# Patient Record
Sex: Female | Born: 1985
Health system: Southern US, Community
[De-identification: ages and names within clinical notes are randomized; demographics above are authoritative.]

## PROBLEM LIST (undated history)

## (undated) DIAGNOSIS — J302 Other seasonal allergic rhinitis: Secondary | ICD-10-CM

## (undated) DIAGNOSIS — N809 Endometriosis, unspecified: Secondary | ICD-10-CM

## (undated) DIAGNOSIS — O009 Unspecified ectopic pregnancy without intrauterine pregnancy: Secondary | ICD-10-CM

## (undated) HISTORY — DX: Endometriosis, unspecified: N80.9

## (undated) HISTORY — PX: LAPAROSCOPIC ENDOMETRIOSIS FULGURATION: SUR769

---

## 2006-08-07 ENCOUNTER — Ambulatory Visit: Payer: Self-pay | Admitting: Family Medicine

## 2006-08-07 ENCOUNTER — Other Ambulatory Visit: Admission: RE | Admit: 2006-08-07 | Discharge: 2006-08-07 | Payer: Self-pay | Admitting: Family Medicine

## 2006-08-07 ENCOUNTER — Encounter: Payer: Self-pay | Admitting: Family Medicine

## 2006-08-07 DIAGNOSIS — J309 Allergic rhinitis, unspecified: Secondary | ICD-10-CM | POA: Insufficient documentation

## 2006-08-09 ENCOUNTER — Ambulatory Visit: Payer: Self-pay | Admitting: Family Medicine

## 2007-09-02 ENCOUNTER — Ambulatory Visit: Payer: Self-pay | Admitting: Family Medicine

## 2007-10-14 ENCOUNTER — Ambulatory Visit: Payer: Self-pay | Admitting: Family Medicine

## 2007-10-14 DIAGNOSIS — J45909 Unspecified asthma, uncomplicated: Secondary | ICD-10-CM | POA: Insufficient documentation

## 2008-07-21 ENCOUNTER — Ambulatory Visit: Payer: Self-pay | Admitting: Family Medicine

## 2008-08-16 ENCOUNTER — Other Ambulatory Visit: Admission: RE | Admit: 2008-08-16 | Discharge: 2008-08-16 | Payer: Self-pay | Admitting: Family Medicine

## 2008-08-16 ENCOUNTER — Ambulatory Visit: Payer: Self-pay | Admitting: Family Medicine

## 2008-08-16 ENCOUNTER — Encounter: Payer: Self-pay | Admitting: Family Medicine

## 2008-08-16 DIAGNOSIS — G43009 Migraine without aura, not intractable, without status migrainosus: Secondary | ICD-10-CM | POA: Insufficient documentation

## 2008-08-17 LAB — CONVERTED CEMR LAB
AST: 11 units/L (ref 0–37)
Alkaline Phosphatase: 42 units/L (ref 39–117)
BUN: 10 mg/dL (ref 6–23)
Calcium: 9.6 mg/dL (ref 8.4–10.5)
Creatinine, Ser: 0.7 mg/dL (ref 0.40–1.20)
Glucose, Bld: 110 mg/dL — ABNORMAL HIGH (ref 70–99)
LDL Cholesterol: 140 mg/dL — ABNORMAL HIGH (ref 0–99)
VLDL: 16 mg/dL (ref 0–40)

## 2008-09-03 ENCOUNTER — Ambulatory Visit: Payer: Self-pay | Admitting: Family Medicine

## 2008-09-03 DIAGNOSIS — R7309 Other abnormal glucose: Secondary | ICD-10-CM | POA: Insufficient documentation

## 2008-11-19 ENCOUNTER — Telehealth: Payer: Self-pay | Admitting: Family Medicine

## 2008-12-13 ENCOUNTER — Ambulatory Visit: Payer: Self-pay | Admitting: Family Medicine

## 2009-03-03 ENCOUNTER — Ambulatory Visit: Payer: Self-pay | Admitting: Family Medicine

## 2009-03-03 LAB — CONVERTED CEMR LAB
Bilirubin Urine: NEGATIVE
Ketones, urine, test strip: NEGATIVE
Nitrite: NEGATIVE
Specific Gravity, Urine: 1.01
WBC Urine, dipstick: NEGATIVE
pH: 6.5

## 2009-04-28 ENCOUNTER — Encounter: Payer: Self-pay | Admitting: Family Medicine

## 2009-05-23 ENCOUNTER — Ambulatory Visit: Payer: Self-pay | Admitting: Occupational Medicine

## 2009-08-15 ENCOUNTER — Ambulatory Visit: Payer: Self-pay | Admitting: Family Medicine

## 2009-08-15 LAB — CONVERTED CEMR LAB
Glucose, Urine, Semiquant: NEGATIVE
Urobilinogen, UA: 0.2
WBC Urine, dipstick: NEGATIVE

## 2009-08-16 ENCOUNTER — Encounter: Payer: Self-pay | Admitting: Family Medicine

## 2009-08-22 ENCOUNTER — Encounter: Admission: RE | Admit: 2009-08-22 | Discharge: 2009-08-22 | Payer: Self-pay | Admitting: Family Medicine

## 2009-08-22 ENCOUNTER — Ambulatory Visit: Payer: Self-pay | Admitting: Family Medicine

## 2009-08-22 DIAGNOSIS — R059 Cough, unspecified: Secondary | ICD-10-CM | POA: Insufficient documentation

## 2009-08-22 DIAGNOSIS — R05 Cough: Secondary | ICD-10-CM

## 2009-08-22 LAB — CONVERTED CEMR LAB
Bilirubin Urine: NEGATIVE
Blood in Urine, dipstick: NEGATIVE
Ketones, urine, test strip: NEGATIVE
Nitrite: NEGATIVE
Protein, U semiquant: NEGATIVE
Urobilinogen, UA: 0.2
WBC Urine, dipstick: NEGATIVE

## 2009-08-23 ENCOUNTER — Encounter: Payer: Self-pay | Admitting: Family Medicine

## 2009-08-25 ENCOUNTER — Telehealth: Payer: Self-pay | Admitting: Family Medicine

## 2009-09-14 ENCOUNTER — Encounter: Payer: Self-pay | Admitting: Family Medicine

## 2009-10-31 ENCOUNTER — Ambulatory Visit: Payer: Self-pay | Admitting: Family Medicine

## 2009-10-31 DIAGNOSIS — J019 Acute sinusitis, unspecified: Secondary | ICD-10-CM | POA: Insufficient documentation

## 2010-01-19 ENCOUNTER — Encounter: Payer: Self-pay | Admitting: Family Medicine

## 2010-02-08 NOTE — Assessment & Plan Note (Signed)
Summary: POSSIBLE SINUS INFECTION   Vital Signs:  Patient Profile:   25 Years Old Female CC:      Taking amoxicillian until 2 weeks ago then sx returned, earache, sore throat, cough, congestion x 2 weeks Height:     65.2 inches Weight:      145 pounds O2 Sat:      98 % O2 treatment:    Room Air Temp:     99.7 degrees F oral Pulse rate:   109 / minute Pulse rhythm:   regular Resp:     12 per minute BP sitting:   127 / 83  (right arm) Cuff size:   regular  Vitals Entered By: Emilio Math (May 23, 2009 1:09 PM)                  Current Allergies: ! * POLLEN ! * MOLDHistory of Present Illness Chief Complaint: Taking amoxicillian until 2 weeks ago then sx returned, earache, sore throat, cough, congestion x 2 weeks History of Present Illness: Very pleasant 25 YO WF.  History of moderate allergies in the past that required allergy shots.   In mid to late april, she had an episode of sinusitus and was treated with amoxicillin by the [facility].   She has also been out of flonase for about a month.   Since finishing the amoxicillin last week, symptoms of nasal congestion and post nasal drip have returned.  No fever, no ear pain.   Has bad headaches.    No sore throat.   Current Meds ALLEGRA 180 MG  TABS (FEXOFENADINE HCL) Take 1 tablet by mouth once a day KARIVA 0.15-0.02/0.01 MG (21/5) TABS (DESOGESTREL-ETHINYL ESTRADIOL) Take 1 tablet by mouth once a day VENTOLIN HFA 108 (90 BASE) MCG/ACT AERS (ALBUTEROL SULFATE) 2-4 puffs every 4-6 hours as needed FLUTICASONE PROPIONATE 50 MCG/ACT SUSP (FLUTICASONE PROPIONATE) 2 sprays in each nostril once daily AZITHROMYCIN 250 MG  TABS (AZITHROMYCIN) 2 by  mouth today and then 1 daily for 4 days  REVIEW OF SYSTEMS Constitutional Symptoms      Denies fever, chills, night sweats, weight loss, weight gain, and fatigue.  Eyes       Complains of eye drainage.      Denies change in vision, eye pain, glasses, contact lenses, and eye  surgery. Ear/Nose/Throat/Mouth       Complains of ear pain, sinus problems, and sore throat.      Denies hearing loss/aids, change in hearing, ear discharge, dizziness, frequent runny nose, frequent nose bleeds, hoarseness, and tooth pain or bleeding.  Respiratory       Complains of dry cough and asthma.      Denies productive cough, wheezing, shortness of breath, bronchitis, and emphysema/COPD.  Cardiovascular       Denies murmurs, chest pain, and tires easily with exhertion.    Gastrointestinal       Denies stomach pain, nausea/vomiting, diarrhea, constipation, blood in bowel movements, and indigestion. Genitourniary       Denies painful urination, kidney stones, and loss of urinary control. Neurological       Complains of headaches.      Denies paralysis, seizures, and fainting/blackouts. Musculoskeletal       Denies muscle pain, joint pain, joint stiffness, decreased range of motion, redness, swelling, muscle weakness, and gout.  Skin       Denies bruising, unusual mles/lumps or sores, and hair/skin or nail changes.  Psych       Denies mood changes, temper/anger  issues, anxiety/stress, speech problems, depression, and sleep problems.  Past History:  Past Medical History: Reviewed history from 10/14/2007 and no changes required. Current Problems:  ASTHMA, INTRINSIC (ICD-493.10) ALLERGIC RHINITIS (ICD-477.9)  Past Surgical History: Reviewed history from 08/07/2006 and no changes required. None  Family History: Reviewed history from 08/07/2006 and no changes required. Aunt with BrCa,  GF w/ Lung Ca, DM, hi chol, HTN Mother depression  Social History: Reviewed history from 12/13/2008 and no changes required. Part time sub teacher for elementary school.  married.  Never Smoked Alcohol use-no Drug use-no Regular exercise-yes Physical Exam General appearance: well developed, well nourished, no acute distress Eyes: conjunctivae and lids normal Pupils: equal, round,  reactive to light Ears: normal, no lesions or deformities Nasal: swollen red turbinates with congestion Oral/Pharynx: tongue normal, posterior pharynx without erythema or exudate Chest/Lungs: no rales, wheezes, or rhonchi bilateral, breath sounds equal without effort Heart: regular rate and  rhythm, no murmur Assessment New Problems: ALLERGIC RHINITIS (ICD-477.9)   Plan New Medications/Changes: AZITHROMYCIN 250 MG  TABS (AZITHROMYCIN) 2 by  mouth today and then 1 daily for 4 days  #6 x 0, 05/23/2009, Kathrine Haddock MD FLUTICASONE PROPIONATE 50 MCG/ACT SUSP (FLUTICASONE PROPIONATE) 2 sprays in each nostril once daily  #1 x 1, 05/23/2009, Kathrine Haddock MD  New Orders: New Patient Level II [phone] Planning Comments:   needs to get back on flonase.  This has helped much in the past.  I wrote a prescription for azithromycin.   Instructed patient NOT to get it filled yet.   Wait and see if flonase takes care of her symptoms Consider restarting allergy shots if flonase does not improve symptoms.   The patient and/or caregiver has been counseled thoroughly with regard to medications prescribed including dosage, schedule, interactions, rationale for use, and possible side effects and they verbalize understanding.  Diagnoses and expected course of recovery discussed and will return if not improved as expected or if the condition worsens. Patient and/or caregiver verbalized understanding.  Prescriptions: AZITHROMYCIN 250 MG  TABS (AZITHROMYCIN) 2 by  mouth today and then 1 daily for 4 days  #6 x 0   Entered and Authorized by:   Kathrine Haddock MD   Signed by:   Kathrine Haddock MD on 05/23/2009   Method used:   Electronically to        [facility] (retail)       [address]       [place], [place]  [address]       Ph: [phone]       Fax: [phone]   RxID:   [id number] FLUTICASONE PROPIONATE 50 MCG/ACT SUSP (FLUTICASONE PROPIONATE) 2 sprays in each nostril once daily  #1 x 1    Entered and Authorized by:   Kathrine Haddock MD   Signed by:   Kathrine Haddock MD on 05/23/2009   Method used:   Electronically to        [facility] (retail)       [address]       [place], [place]  [address]       Ph: [phone]       Fax: [phone]   RxID:   [id number]

## 2010-02-08 NOTE — Letter (Signed)
[facility]   Imported By: Lanelle Bal 05/05/2009 11:00:35  _____________________________________________________________________  External Attachment:    Type:   Image     Comment:   External Document

## 2010-02-08 NOTE — Assessment & Plan Note (Signed)
Summary: UTI   Vital Signs:  Patient profile:   25 year old female Weight:      149.12 pounds Temp:     97.6 degrees F oral Pulse rate:   109 / minute Pulse rhythm:   regular BP sitting:   134 / 92 Cuff size:   regular  Vitals Entered By: Kern Reap CMA Duncan Dull) (March 03, 2009 1:20 PM) CC: possible UTI, disuria, flank pain Is Patient Diabetic? No   Primary Care Provider:  Linford Arnold, C  CC:  possible UTI, disuria, and flank pain.  History of Present Illness: possible UTI, dysuria, flank pain.  Sxs for about 3 days. No hematuria. Some burning.  Some low back pain. No fever.  No N, V.  Not having her periods. Shooting more to the left. No family hx of kidney stones.  Back doesn't feel ike a pulled muscle.   Allergies: No Known Drug Allergies  Physical Exam  General:  Well-developed,well-nourished,in no acute distress; alert,appropriate and cooperative throughout examination Head:  Normocephalic and atraumatic without obvious abnormalities. No apparent alopecia or balding.   Impression & Recommendations:  Problem # 1:  UTI (ICD-599.0)  Her updated medication list for this problem includes:    Ciprofloxacin Hcl 500 Mg Tabs (Ciprofloxacin hcl) .Marland Kitchen... Take 1 tablet by mouth two times a day for 3 days  Orders: T-Urine Culture (Spectrum Order) [id number]) T-Urine Microscopic [id number])  Encouraged to push clear liquids, get enough rest, and take acetaminophen as needed. To be seen in 10 days if no improvement, sooner if worse. Can also use AZO for sxs relief. If not better by Monday then consider stones since UA only + for for blood.   Complete Medication List: 1)  Allegra 180 Mg Tabs (Fexofenadine hcl) .... Take 1 tablet by mouth once a day 2)  Kariva 0.15-0.02/0.01 Mg (21/5) Tabs (Desogestrel-ethinyl estradiol) .... Take 1 tablet by mouth once a day 3)  Ventolin Hfa 108 (90 Base) Mcg/act Aers (Albuterol sulfate) .... 2-4 puffs every 4-6 hours as needed 4)   Amitriptyline Hcl 25 Mg Tabs (Amitriptyline hcl) .... Take 1 tablet by mouth once a day 5)  Ciprofloxacin Hcl 500 Mg Tabs (Ciprofloxacin hcl) .... Take 1 tablet by mouth two times a day for 3 days  Other Orders: UA Dipstick w/o Micro (automated)  (81003) Prescriptions: CIPROFLOXACIN HCL 500 MG TABS (CIPROFLOXACIN HCL) Take 1 tablet by mouth two times a day for 3 days  #6 x 0   Entered and Authorized by:   Nani Gasser MD   Signed by:   Nani Gasser MD on 03/03/2009   Method used:   Electronically to        [facility]  [address] (retail)       [address]       [place], [place]  [address]       Ph: [phone]       Fax: [phone]   RxID:   [id number]   Laboratory Results   Urine Tests  Date/Time Received: March 03, 2009   Routine Urinalysis   Color: yellow Appearance: Clear Glucose: negative   (Normal Range: Negative) Bilirubin: negative   (Normal Range: Negative) Ketone: negative   (Normal Range: Negative) Spec. Gravity: 1.010   (Normal Range: 1.003-1.035) Blood: trace-intact   (Normal Range: Negative) pH: 6.5   (Normal Range: 5.0-8.0) Protein: negative   (Normal Range: Negative) Urobilinogen: 0.2   (Normal Range: 0-1) Nitrite: negative   (Normal Range: Negative) Leukocyte Esterace: negative   (  Normal Range: Negative)    Comments: Kern Reap CMA (AAMA)  March 03, 2009 1:26 PM

## 2010-02-08 NOTE — Assessment & Plan Note (Signed)
Summary: UTI, chroinic cough   Vital Signs:  Patient profile:   25 year old female Height:      65.2 inches Weight:      149 pounds Pulse rate:   93 / minute BP sitting:   124 / 79  (left arm) Cuff size:   regular  Vitals Entered By: Avon Gully CMA, Duncan Dull) (August 22, 2009 3:42 PM) CC: UTI--still having, cramping, back pain and burning with urination   Primary Care Provider:  Linford Arnold, C  CC:  UTI--still having, cramping, and back pain and burning with urination.  History of Present Illness: UTI--still having, cramping, back pain and burning with urination.Completed the ABX and felt some better intially but sxs never completley went away and back to same sxs as before. No fever.    Chronic cough for years. Has been seeing an allergies for 3 monts and not really better with hre allergy tx. Was on a GERD med at one time but felt it didn't help her cough. Describes the cough and dry, annoying and like a tickle sometimes.  No fever or recent URI sxs.  No worsening sxs. No alleviating sxs.  Thought it was just her allergies for year. She is currently getting immunotherapy as well. She is fearful of cancdr as she has had 2 young friends recently dx with cancer. N owheezing or SOB. No GERD or heartburn sxs. .   Allergies: 1)  ! * Pollen 2)  ! * Mold  Past History:  Past Medical History: Last updated: 10/14/2007 Current Problems:  ASTHMA, INTRINSIC (ICD-493.10) ALLERGIC RHINITIS (ICD-477.9)  Physical Exam  General:  Well-developed,well-nourished,in no acute distress; alert,appropriate and cooperative throughout examination Head:  Normocephalic and atraumatic without obvious abnormalities. No apparent alopecia or balding. Eyes:  No corneal or conjunctival inflammation noted. EOMI. Perrla. Ears:  External ear exam shows no significant lesions or deformities.  Otoscopic examination reveals clear canals, tympanic membranes are intact bilaterally without bulging, retraction,  inflammation or discharge. Hearing is grossly normal bilaterally. Nose:  External nasal examination shows no deformity or inflammation. Nasal mucosa are pink and moist without lesions or exudates. Mouth:  Oral mucosa and oropharynx without lesions or exudates.  Teeth in good repair. Neck:  No deformities, masses, or tenderness noted. Lungs:  Normal respiratory effort, chest expands symmetrically. Lungs are clear to auscultation, no crackles or wheezes. Heart:  Normal rate and regular rhythm. S1 and S2 normal without gallop, murmur, click, rub or other extra sounds.   Impression & Recommendations:  Problem # 1:  UTI (ICD-599.0) Will change ABX to Cipro and resend cultlure. Last culture was contaminanted. If comes back contaminated or neck then will refer to Urology for furthe evaluation.  The following medications were removed from the medication list:    Bactrim Ds 800-160 Mg Tabs (Sulfamethoxazole-trimethoprim) .Marland Kitchen... Take 1 tablet by mouth two times a day for 3 days. Her updated medication list for this problem includes:    Ciprofloxacin Hcl 500 Mg Tabs (Ciprofloxacin hcl) .Marland Kitchen... Take 1 tablet by mouth two times a day  Orders: T-Urine Culture (Spectrum Order) [id number])  Problem # 2:  COUGH, CHRONIC (ICD-786.2) Unclear etiology. Allergies are unlikely at this point. Could consider retrying oral PPI for 2 weeks to see if helps. will get xray sinc unexplained cough for this long.  If neg then consider eNT referral to look at her throat and vocal cords as a possible cause.  Orders: T-Chest x-ray, 2 views ([id number])  Complete Medication List: 1)  Kariva 0.15-0.02/0.01  Mg (21/5) Tabs (Desogestrel-ethinyl estradiol) .... Take 1 tablet by mouth once a day 2)  Ventolin Hfa 108 (90 Base) Mcg/act Aers (Albuterol sulfate) .... 2-4 puffs every 4-6 hours as needed 3)  Fluticasone Propionate 50 Mcg/act Susp (Fluticasone propionate) .... 2 sprays in each nostril once daily 4)  Xyzal 5 Mg Tabs  (Levocetirizine dihydrochloride) .... Take one tablet by mouth once a day 5)  Singulair 10 Mg Tabs (Montelukast sodium) .... Take one tablet by mouth once a day 6)  Ciprofloxacin Hcl 500 Mg Tabs (Ciprofloxacin hcl) .... Take 1 tablet by mouth two times a day  Other Orders: UA Dipstick w/o Micro (automated)  (81003) Prescriptions: CIPROFLOXACIN HCL 500 MG TABS (CIPROFLOXACIN HCL) Take 1 tablet by mouth two times a day  #6 x 0   Entered and Authorized by:   Nani Gasser MD   Signed by:   Nani Gasser MD on 08/22/2009   Method used:   Electronically to        [facility]  [address] (retail)       [address]       [place], [place]  [address]       Ph: [phone]       Fax: [phone]   RxID:   [id number] BACTRIM DS 800-160 MG TABS (SULFAMETHOXAZOLE-TRIMETHOPRIM) Take 1 tablet by mouth two times a day for 3 days.  #6 x 0   Entered and Authorized by:   Nani Gasser MD   Signed by:   Nani Gasser MD on 08/22/2009   Method used:   Electronically to        [facility] (retail)       [address]       [place], [place]  [address]       Ph: [phone]       Fax: [phone]   RxID:   [id number]   Laboratory Results   Urine Tests  Date/Time Received: 08/22/09 Date/Time Reported: 08/22/09  Routine Urinalysis   Color: yellow Appearance: Clear Glucose: negative   (Normal Range: Negative) Bilirubin: negative   (Normal Range: Negative) Ketone: negative   (Normal Range: Negative) Spec. Gravity: 1.020   (Normal Range: 1.003-1.035) Blood: negative   (Normal Range: Negative) pH: 7.5   (Normal Range: 5.0-8.0) Protein: negative   (Normal Range: Negative) Urobilinogen: 0.2   (Normal Range: 0-1) Nitrite: negative   (Normal Range: Negative) Leukocyte Esterace: negative   (Normal Range: Negative)

## 2010-02-08 NOTE — Progress Notes (Signed)
Summary: scabies  Phone Note Call from Patient   Caller: Patient Call For: Breanna Gasser MD Summary of Call: pt called and states her mother and grandmother have been dx with scabies and pt now has a rash that looks like her mothers and grandmothes. WOuld you call something in for her or does she need appt? Initial call taken by: Avon Gully CMA, Duncan Dull),  August 25, 2009 9:50 AM  Follow-up for Phone Call        Will call in but if not getting better in 2 weeks then come in.  Follow-up by: Breanna Gasser MD,  August 25, 2009 11:35 AM  Additional Follow-up for Phone Call Additional follow up Details #1::        pt notified Additional Follow-up by: Avon Gully CMA, Duncan Dull),  August 25, 2009 11:42 AM    New/Updated Medications: PERMETHRIN 5 % CREA (PERMETHRIN) Apply from neck down to soles of fee. Wash off after 8-12 hours.  Cna repeat treatment in 10 days. Prescriptions: PERMETHRIN 5 % CREA (PERMETHRIN) Apply from neck down to soles of fee. Wash off after 8-12 hours.  Cna repeat treatment in 10 days.  #2 bottles. x 0   Entered and Authorized by:   Breanna Gasser MD   Signed by:   Breanna Gasser MD on 08/25/2009   Method used:   Electronically to        [facility] (retail)       [address]       [place], [place]  [address]       Ph: [phone]       Fax: [phone]   RxID:   [id number]

## 2010-02-08 NOTE — Consult Note (Signed)
Summary: [facility]  [facility]   Imported By: Lanelle Bal 09/27/2009 13:18:49  _____________________________________________________________________  External Attachment:    Type:   Image     Comment:   External Document

## 2010-02-08 NOTE — Assessment & Plan Note (Signed)
Summary: sinusitis asthma   Vital Signs:  Patient profile:   25 year old female Height:      65.2 inches Weight:      147 pounds O2 Sat:      98 % on Room air Pulse rate:   54 / minute BP sitting:   122 / 79  (right arm) Cuff size:   regular  Vitals Entered By: Avon Gully CMA, Duncan Dull) (October 31, 2009 3:06 PM)  O2 Flow:  Room air  Serial Vital Signs/Assessments:  Comments: 3:09 PM  pre peak flow: 350, 350,350 pt is in the yellow zone By: Avon Gully CMA, (AAMA)  3:46 PM post peak flow [phone] pt is in the green zone By: Avon Gully CMA, (AAMA)   CC: congestion x 3 weeks, feels like its going in her chest, SOB, productive cough    Primary Care Provider:  Linford Arnold, C  CC:  congestion x 3 weeks, feels like its going in her chest, SOB, and productive cough .  History of Present Illness: congestion x 3 weeks, feels like its going in her chest, SOB, productive cough.  Sneezing, runny nose.  Using her rescue inhaler once a day in addition to her symbicort.  FEver early on, none now. Cough initally.  Sinus HA.  No ear pain.  They feel stuffy.  +Nasal congestion. Took some cold meds. Did a NEB tx this AM- felt some better.   Asthma History    Initial Asthma Severity Rating:    Age range: 12+ years    Symptoms: daily    Nighttime Awakenings: often 7/week    Asthma Severity Assessment: Severe Persistent  Current Medications (verified): 1)  Kariva 0.15-0.02/0.01 Mg (21/5) Tabs (Desogestrel-Ethinyl Estradiol) .... Take 1 Tablet By Mouth Once A Day 2)  Ventolin Hfa 108 (90 Base) Mcg/act Aers (Albuterol Sulfate) .... 2-4 Puffs Every 4-6 Hours As Needed 3)  Fluticasone Propionate 50 Mcg/act Susp (Fluticasone Propionate) .... 2 Sprays in Each Nostril Once Daily 4)  Xyzal 5 Mg Tabs (Levocetirizine Dihydrochloride) .... Take One Tablet By Mouth Once A Day 5)  Singulair 10 Mg Tabs (Montelukast Sodium) .... Take One Tablet By Mouth Once A Day  Allergies  (verified): 1)  ! * Pollen 2)  ! * Mold  Comments:  Nurse/Medical Assistant: Avon Gully CMA, Duncan Dull) (October 31, 2009 3:08 PM) Avon Gully CMA, Duncan Dull) (October 31, 2009 3:08 PM) The patient's medications and allergies were reviewed with the patient and were updated in the Medication and Allergy Lists.  Physical Exam  General:  Well-developed,well-nourished,in no acute distress; alert,appropriate and cooperative throughout examination Head:  Normocephalic and atraumatic without obvious abnormalities. No apparent alopecia or balding. Eyes:  No corneal or conjunctival inflammation noted. EOMI. Perrla. Ears:  External ear exam shows no significant lesions or deformities.  Otoscopic examination reveals clear canals, tympanic membranes are intact bilaterally without bulging, retraction, inflammation or discharge. Hearing is grossly normal bilaterally. Nose:  External nasal examination shows no deformity or inflammation. Nasal mucosa are pink and moist without lesions or exudates. Mouth:  Oral mucosa and oropharynx without lesions or exudates.  Teeth in good repair. Neck:  No deformities, masses, or tenderness noted. No TM  Lungs:  Normal respiratory effort, chest expands symmetrically. Lungs are clear to auscultation, no crackles or wheezes. Heart:  Normal rate and regular rhythm. S1 and S2 normal without gallop, murmur, click, rub or other extra sounds. Skin:  no rashes.   Cervical Nodes:  No lymphadenopathy noted Psych:  Cognition and  judgment appear intact. Alert and cooperative with normal attention span and concentration. No apparent delusions, illusions, hallucinations   Impression & Recommendations:  Problem # 1:  SINUSITIS - ACUTE-NOS (ICD-461.9) Complete ABX.  Her updated medication list for this problem includes:    Fluticasone Propionate 50 Mcg/act Susp (Fluticasone propionate) .Marland Kitchen... 2 sprays in each nostril once daily    Amoxicillin 875 Mg Tabs (Amoxicillin) .Marland Kitchen...  Take 1 tablet by mouth two times a day for 10 days  Instructed on treatment. Call if symptoms persist or worsen.   Problem # 2:  ASTHMA, INTRINSIC (ICD-493.10)  Use albuterol more often until feeling better. Her peak flows did  improve after the NEB tx.  If still using he albuterol 3 or more times per week after complete ABX ten call the office as will need a prophylactic med. .  Her updated medication list for this problem includes:    Ventolin Hfa 108 (90 Base) Mcg/act Aers (Albuterol sulfate) .Marland Kitchen... 2-4 puffs every 4-6 hours as needed    Singulair 10 Mg Tabs (Montelukast sodium) .Marland Kitchen... Take one tablet by mouth once a day  Orders: Nebulizer Tx (04540) Peak Flow Rate (94150) Albuterol Sulfate Sol 1mg  unit dose (J8119)  Complete Medication List: 1)  Kariva 0.15-0.02/0.01 Mg (21/5) Tabs (Desogestrel-ethinyl estradiol) .... Take 1 tablet by mouth once a day 2)  Ventolin Hfa 108 (90 Base) Mcg/act Aers (Albuterol sulfate) .... 2-4 puffs every 4-6 hours as needed 3)  Fluticasone Propionate 50 Mcg/act Susp (Fluticasone propionate) .... 2 sprays in each nostril once daily 4)  Xyzal 5 Mg Tabs (Levocetirizine dihydrochloride) .... Take one tablet by mouth once a day 5)  Singulair 10 Mg Tabs (Montelukast sodium) .... Take one tablet by mouth once a day 6)  Amoxicillin 875 Mg Tabs (Amoxicillin) .... Take 1 tablet by mouth two times a day for 10 days  Other Orders: Flu Vaccine 71yrs + (14782) Admin 1st Vaccine (95621)    Patient Instructions: 1)  Increase use of your inhaler based on your peak flows.  2)  Call if getting worse.  3)  Complete your antibiotic.  4)  Can use delsym for cough suppression.  Prescriptions: AMOXICILLIN 875 MG TABS (AMOXICILLIN) Take 1 tablet by mouth two times a day for 10 days  #20 x 0   Entered and Authorized by:   Nani Gasser MD   Signed by:   Nani Gasser MD on 10/31/2009   Method used:   Electronically to        [facility]  [address] (retail)        [address]       [place], [place]  [address]       Ph: [phone]       Fax: [phone]   RxID:   [id number]    Medication Administration  Medication # 1:    Medication: Albuterol Sulfate Sol 1mg  unit dose    Diagnosis: ASTHMA, INTRINSIC (ICD-493.10)    Dose: 2.5mg      Route: inhaled  Orders Added: 1)  Est. Patient Level IV [99214] 2)  Nebulizer Tx [94640] 3)  Peak Flow Rate [94150] 4)  Flu Vaccine 65yrs + [90658] 5)  Admin 1st Vaccine [90471] 6)  Albuterol Sulfate Sol 1mg  unit dose [Z5638]   Immunizations Administered:  Influenza Vaccine # 1:    Vaccine Type: Fluvax 3+    Site: right deltoid    Mfr: GlaxoSmithKline    Dose: 0.5 ml    Route: IM  Given by: Sue Lush McCrimmon CMA, (AAMA)    Exp. Date: 07/08/2010    Lot #: [id number]    VIS given: 08/02/09 version given October 31, 2009.  Flu Vaccine Consent Questions:    Do you have a history of severe allergic reactions to this vaccine? no    Any prior history of allergic reactions to egg and/or gelatin? no    Do you have a sensitivity to the preservative Thimersol? no    Do you have a past history of Guillan-Barre Syndrome? no    Do you currently have an acute febrile illness? no    Have you ever had a severe reaction to latex? no    Vaccine information given and explained to patient? no    Are you currently pregnant? no   Immunizations Administered:  Influenza Vaccine # 1:    Vaccine Type: Fluvax 3+    Site: right deltoid    Mfr: GlaxoSmithKline    Dose: 0.5 ml    Route: IM    Given by: Sue Lush McCrimmon CMA, (AAMA)    Exp. Date: 07/08/2010    Lot #: [id number]    VIS given: 08/02/09 version given October 31, 2009.

## 2010-02-08 NOTE — Assessment & Plan Note (Signed)
Summary: UTI   Vital Signs:  Patient profile:   25 year old female Height:      65.2 inches Weight:      151 pounds BMI:     25.06 Temp:     98.4 degrees F oral Pulse rate:   115 / minute BP sitting:   126 / 84  (left arm) Cuff size:   regular  Vitals Entered By: Kathlene November (August 15, 2009 9:59 AM) CC: urinary frequency, burning , lower back pain, lower abdominal pain x 4 days   Primary Care Provider:  Linford Arnold, C  CC:  urinary frequency, burning , lower back pain, and lower abdominal pain x 4 days.  History of Present Illness: nO fever. Tried eating cranberries. No hematuria.  No recent ABX use.    Current Medications (verified): 1)  Kariva 0.15-0.02/0.01 Mg (21/5) Tabs (Desogestrel-Ethinyl Estradiol) .... Take 1 Tablet By Mouth Once A Day 2)  Ventolin Hfa 108 (90 Base) Mcg/act Aers (Albuterol Sulfate) .... 2-4 Puffs Every 4-6 Hours As Needed 3)  Fluticasone Propionate 50 Mcg/act Susp (Fluticasone Propionate) .... 2 Sprays in Each Nostril Once Daily 4)  Xyzal 5 Mg Tabs (Levocetirizine Dihydrochloride) .... Take One Tablet By Mouth Once A Day 5)  Singulair 10 Mg Tabs (Montelukast Sodium) .... Take One Tablet By Mouth Once A Day  Allergies (verified): 1)  ! * Pollen 2)  ! * Mold  Comments:  Nurse/Medical Assistant: The patient's medications and allergies were reviewed with the patient and were updated in the Medication and Allergy Lists. Kathlene November (August 15, 2009 10:00 AM)  Physical Exam  General:  Well-developed,well-nourished,in no acute distress; alert,appropriate and cooperative throughout examination Abdomen:  Tender on the RLQ and LLQ.  Msk:  No CVA tenderness.    Impression & Recommendations:  Problem # 1:  UTI (ICD-599.0) UA is neg but her sxs are classic.  Will send culture and go ahead and treat.  Her updated medication list for this problem includes:    Bactrim Ds 800-160 Mg Tabs (Sulfamethoxazole-trimethoprim) .Marland Kitchen... Take 1 tablet by mouth two  times a day for 3 days.  Orders: T-Urine Culture (Spectrum Order) [phone])  Encouraged to push clear liquids, get enough rest, and take acetaminophen as needed. To be seen in 10 days if no improvement, sooner if worse.  Complete Medication List: 1)  Kariva 0.15-0.02/0.01 Mg (21/5) Tabs (Desogestrel-ethinyl estradiol) .... Take 1 tablet by mouth once a day 2)  Ventolin Hfa 108 (90 Base) Mcg/act Aers (Albuterol sulfate) .... 2-4 puffs every 4-6 hours as needed 3)  Fluticasone Propionate 50 Mcg/act Susp (Fluticasone propionate) .... 2 sprays in each nostril once daily 4)  Xyzal 5 Mg Tabs (Levocetirizine dihydrochloride) .... Take one tablet by mouth once a day 5)  Singulair 10 Mg Tabs (Montelukast sodium) .... Take one tablet by mouth once a day 6)  Bactrim Ds 800-160 Mg Tabs (Sulfamethoxazole-trimethoprim) .... Take 1 tablet by mouth two times a day for 3 days.  Other Orders: UA Dipstick w/o Micro (automated)  (81003)  Patient Instructions: 1)  Call if not better in 3 days.  Prescriptions: BACTRIM DS 800-160 MG TABS (SULFAMETHOXAZOLE-TRIMETHOPRIM) Take 1 tablet by mouth two times a day for 3 days.  #6 x 0   Entered and Authorized by:   Nani Gasser MD   Signed by:   Nani Gasser MD on 08/15/2009   Method used:   Electronically to        [facility]  [address] (  retail)       [address]       [place], [place]  [address]       Ph: [phone]       Fax: [phone]   RxID:   [id number]   Laboratory Results   Urine Tests  Date/Time Received: 08/15/2009 Date/Time Reported: 08/15/2009  Routine Urinalysis   Color: yellow Appearance: Clear Glucose: negative   (Normal Range: Negative) Bilirubin: negative   (Normal Range: Negative) Ketone: negative   (Normal Range: Negative) Spec. Gravity: 1.010   (Normal Range: 1.003-1.035) Blood: negative   (Normal Range: Negative) pH: 5.5   (Normal Range: 5.0-8.0) Protein: negative   (Normal Range: Negative) Urobilinogen: 0.2    (Normal Range: 0-1) Nitrite: negative   (Normal Range: Negative) Leukocyte Esterace: negative   (Normal Range: Negative)

## 2010-02-09 NOTE — Letter (Signed)
[facility]   Imported By: Lanelle Bal 02/02/2010 10:45:11  _____________________________________________________________________  External Attachment:    Type:   Image     Comment:   External Document

## 2010-02-09 NOTE — Letter (Signed)
Summary: [facility]   Imported By: Lanelle Bal 02/02/2010 10:51:40  _____________________________________________________________________  External Attachment:    Type:   Image     Comment:   External Document

## 2010-03-14 ENCOUNTER — Encounter: Payer: Self-pay | Admitting: Family Medicine

## 2010-03-14 ENCOUNTER — Ambulatory Visit (INDEPENDENT_AMBULATORY_CARE_PROVIDER_SITE_OTHER): Payer: 59 | Admitting: Family Medicine

## 2010-03-14 DIAGNOSIS — R609 Edema, unspecified: Secondary | ICD-10-CM

## 2010-03-15 ENCOUNTER — Encounter: Payer: Self-pay | Admitting: Family Medicine

## 2010-03-15 LAB — CONVERTED CEMR LAB
AST: 12 units/L (ref 0–37)
Albumin: 4.8 g/dL (ref 3.5–5.2)
CO2: 26 meq/L (ref 19–32)
Creatinine, Ser: 0.72 mg/dL (ref 0.40–1.20)
Glucose, Bld: 75 mg/dL (ref 70–99)
Total Protein: 7.3 g/dL (ref 6.0–8.3)

## 2010-03-16 ENCOUNTER — Other Ambulatory Visit: Payer: Self-pay | Admitting: Sports Medicine

## 2010-03-16 ENCOUNTER — Ambulatory Visit
Admission: RE | Admit: 2010-03-16 | Discharge: 2010-03-16 | Disposition: A | Discharge status: Home / Self Care | Payer: 59 | Source: Ambulatory Visit | Attending: Sports Medicine | Admitting: Sports Medicine

## 2010-03-16 DIAGNOSIS — M79606 Pain in leg, unspecified: Secondary | ICD-10-CM

## 2010-03-16 DIAGNOSIS — M545 Low back pain, unspecified: Secondary | ICD-10-CM

## 2010-03-16 DIAGNOSIS — M79604 Pain in right leg: Secondary | ICD-10-CM

## 2010-03-18 ENCOUNTER — Ambulatory Visit
Admission: RE | Admit: 2010-03-18 | Discharge: 2010-03-18 | Disposition: A | Discharge status: Home / Self Care | Payer: 59 | Source: Ambulatory Visit | Attending: Sports Medicine | Admitting: Sports Medicine

## 2010-03-18 DIAGNOSIS — M79604 Pain in right leg: Secondary | ICD-10-CM

## 2010-03-20 ENCOUNTER — Other Ambulatory Visit (HOSPITAL_COMMUNITY): Payer: Self-pay

## 2010-03-21 NOTE — Assessment & Plan Note (Signed)
Summary: right leg pain   Vital Signs:  Patient profile:   25 year old female Height:      65.2 inches Weight:      147 pounds Pulse rate:   83 / minute BP sitting:   143 / 90  (right arm) Cuff size:   regular  Vitals Entered By: Avon Gully CMA, Duncan Dull) (March 14, 2010 2:38 PM) CC: rt hip pain x 4 weeks, radiates down her leg   Primary Care Provider:  Linford Arnold, C  CC:  rt hip pain x 4 weeks and radiates down her leg.  History of Present Illness: rt hip pain x 4 weeks, radiates down her leg to her calf. Aches pain. Worse to stand on it and as the day progresses. Driving exacerbates it. Laying down seem to improve her pain. NO numbness or tingling.  No trauma or exercise. Using Advil and Alevel - not really helping.  Has started to limp.  Started to see a chiropractor about 6 weeks ago for her back and now her back feels bettter but but leg isn't better. No wt loss, No GI sxs.  No HA.  pain is persistant.  Has woken her up a couple of times that her leg is cramping. NO CP or SOB.   Current Medications (verified): 1)  Kariva 0.15-0.02/0.01 Mg (21/5) Tabs (Desogestrel-Ethinyl Estradiol) .... Take 1 Tablet By Mouth Once A Day 2)  Ventolin Hfa 108 (90 Base) Mcg/act Aers (Albuterol Sulfate) .... 2-4 Puffs Every 4-6 Hours As Needed 3)  Fluticasone Propionate 50 Mcg/act Susp (Fluticasone Propionate) .... 2 Sprays in Each Nostril Once Daily  Allergies (verified): 1)  ! * Pollen 2)  ! * Mold  Comments:  Nurse/Medical Assistant: The patient's medications and allergies were reviewed with the patient and were updated in the Medication and Allergy Lists. Avon Gully CMA, Duncan Dull) (March 14, 2010 2:39 PM)  Social History: Reviewed history from 12/13/2008 and no changes required. Part time sub teacher for elementary school.   she is currently working at a tax office.married.  Never Smoked Alcohol use-no Drug use-no Regular exercise-yes  Physical Exam  General:   Well-developed,well-nourished,in no acute distress; alert,appropriate and cooperative throughout examination Lungs:  Normal respiratory effort, chest expands symmetrically. Lungs are clear to auscultation, no crackles or wheezes. Heart:  Normal rate and regular rhythm. S1 and S2 normal without gallop, murmur, click, rub or other extra sounds. Msk:  Right calf  with swelling. It is larger than the left. TEnder at the base with calf squeeze. Lumbar spine woht NROM, she has great mobility. Hips, knees and ankles with NORM and strength 5/5 bilat.  no pain at the hip with internal and external rotation. Extremities:   no ankle edema. to her right Is no larger than her left. no erythema or discoloration. Neurologic:  DTRs symmetrical and normal.     Impression & Recommendations:  Problem # 1:  LEG EDEMA (ICD-782.3)  because her right calf is larger than her left and would like to consider a DVT since she is on birth control. We will check a d-dimer today. It would be a little unusual for it has been there for 4 weeks and  not have suddenly gotten a lot worse. Also check a complete metabolic panel  including liver and kidney function as well as electrolytes. Especially since she's been having some cramping. I'm unable to recreate or worsen her symptoms on exam. But certainly she could have some pain originating from her low  back.  Though her low back has been better with chiropractic care Orders: T-D-Dimer Fibrin Derivatives Quantitive [id number]) T-Comprehensive Metabolic Panel [phone])  Complete Medication List: 1)  Kariva 0.15-0.02/0.01 Mg (21/5) Tabs (Desogestrel-ethinyl estradiol) .... Take 1 tablet by mouth once a day 2)  Ventolin Hfa 108 (90 Base) Mcg/act Aers (Albuterol sulfate) .... 2-4 puffs every 4-6 hours as needed 3)  Fluticasone Propionate 50 Mcg/act Susp (Fluticasone propionate) .... 2 sprays in each nostril once daily   Orders Added: 1)  T-D-Dimer Fibrin Derivatives Quantitive  [[id number]] 2)  T-Comprehensive Metabolic Panel [[id number]] 3)  Est. Patient Level IV [57846]

## 2010-03-28 ENCOUNTER — Ambulatory Visit: Payer: 59 | Attending: Sports Medicine | Admitting: Physical Therapy

## 2010-03-28 DIAGNOSIS — IMO0001 Reserved for inherently not codable concepts without codable children: Secondary | ICD-10-CM | POA: Insufficient documentation

## 2010-03-28 DIAGNOSIS — M25559 Pain in unspecified hip: Secondary | ICD-10-CM | POA: Insufficient documentation

## 2010-03-30 ENCOUNTER — Ambulatory Visit: Payer: 59 | Admitting: Physical Therapy

## 2010-04-04 ENCOUNTER — Encounter: Payer: 59 | Admitting: Physical Therapy

## 2010-04-04 ENCOUNTER — Ambulatory Visit: Payer: 59 | Admitting: Physical Therapy

## 2010-04-06 ENCOUNTER — Encounter: Payer: 59 | Admitting: Physical Therapy

## 2010-04-07 ENCOUNTER — Encounter: Payer: 59 | Admitting: Physical Therapy

## 2010-04-10 ENCOUNTER — Ambulatory Visit: Payer: 59 | Attending: Family Medicine | Admitting: Physical Therapy

## 2010-04-10 DIAGNOSIS — M25559 Pain in unspecified hip: Secondary | ICD-10-CM | POA: Insufficient documentation

## 2010-04-10 DIAGNOSIS — IMO0001 Reserved for inherently not codable concepts without codable children: Secondary | ICD-10-CM | POA: Insufficient documentation

## 2010-04-12 ENCOUNTER — Ambulatory Visit: Payer: 59 | Admitting: Physical Therapy

## 2010-04-18 ENCOUNTER — Ambulatory Visit: Payer: 59 | Admitting: Physical Therapy

## 2010-04-21 ENCOUNTER — Ambulatory Visit: Payer: 59 | Admitting: Physical Therapy

## 2010-04-25 ENCOUNTER — Encounter: Payer: 59 | Admitting: Physical Therapy

## 2010-08-11 ENCOUNTER — Other Ambulatory Visit: Payer: Self-pay | Admitting: Family Medicine

## 2010-08-11 MED ORDER — ALBUTEROL SULFATE HFA 108 (90 BASE) MCG/ACT IN AERS: 2.0000 | INHALATION_SPRAY | Freq: Four times a day (QID) | RESPIRATORY_TRACT | Status: DC | PRN

## 2010-08-11 NOTE — Telephone Encounter (Signed)
Pt called and asked for a refill of her ventolin MDI for rescue.  Has current flare.  Last office visit was 10-31-2009 for sinusitis and asthma.  Pt notified that 1 refill would be called but she needs to get in to be seen since it had been a while. Jarvis Newcomer, LPN [facility]

## 2010-08-30 ENCOUNTER — Ambulatory Visit (INDEPENDENT_AMBULATORY_CARE_PROVIDER_SITE_OTHER): Payer: 59 | Admitting: Family Medicine

## 2010-08-30 ENCOUNTER — Other Ambulatory Visit (HOSPITAL_COMMUNITY)
Admission: RE | Admit: 2010-08-30 | Discharge: 2010-08-30 | Disposition: A | Discharge status: Home / Self Care | Payer: 59 | Source: Ambulatory Visit | Attending: Family Medicine | Admitting: Family Medicine

## 2010-08-30 ENCOUNTER — Encounter: Payer: Self-pay | Admitting: Family Medicine

## 2010-08-30 ENCOUNTER — Telehealth: Payer: Self-pay | Admitting: Family Medicine

## 2010-08-30 VITALS — BP 133/85 | HR 77 | Wt 150.0 lb

## 2010-08-30 DIAGNOSIS — Z23 Encounter for immunization: Secondary | ICD-10-CM

## 2010-08-30 DIAGNOSIS — Z01419 Encounter for gynecological examination (general) (routine) without abnormal findings: Secondary | ICD-10-CM | POA: Insufficient documentation

## 2010-08-30 DIAGNOSIS — Z0000 Encounter for general adult medical examination without abnormal findings: Secondary | ICD-10-CM

## 2010-08-30 DIAGNOSIS — R21 Rash and other nonspecific skin eruption: Secondary | ICD-10-CM

## 2010-08-30 MED ORDER — MUPIROCIN 2 % EX OINT: TOPICAL_OINTMENT | Freq: Three times a day (TID) | CUTANEOUS | Status: AC

## 2010-08-30 NOTE — Telephone Encounter (Signed)
Pt said she called her insurance and the ins company will cover necon tier 1 to replace the kariva.   Jarvis Newcomer, LPN [facility]

## 2010-08-30 NOTE — Patient Instructions (Signed)
Start a regular exercise program and make sure you are eating a healthy diet Try to eat 4 servings of dairy a day or take a calcium supplement (500mg twice a day). Your vaccines are up to date.   

## 2010-08-30 NOTE — Progress Notes (Signed)
  Subjective:     Breanna Mcmillan is a 25 y.o. female and is here for a comprehensive physical exam. The patient reports problems - red patch over her Left eye. Says it hs been there for over a month and is very ithcy. She would like to get a fflu shot today. Marland Kitchen  History   Social History  . Marital Status: Married    Spouse Name: micheal    Number of Children: N/A  . Years of Education: N/A   Occupational History  . Print production planner.    Social History Main Topics  . Smoking status: Never Smoker   . Smokeless tobacco: Not on file  . Alcohol Use: Yes  . Drug Use: Not on file  . Sexually Active: Yes -- Female partner(s)   Other Topics Concern  . Not on file   Social History Narrative   Father with IMGUS (possible multiple myeloma).  Occ exercise.  1 caffeine drink per day.    Health Maintenance  Topic Date Due  . Pap Smear  08/29/2013  . Tetanus/tdap  01/09/2015    The following portions of the patient's history were reviewed and updated as appropriate: allergies, current medications, past family history, past medical history, past social history and past surgical history.  Review of Systems A comprehensive review of systems was negative.   Objective:    BP 133/85  Pulse 77  Wt 150 lb (68.04 kg) General appearance: alert, cooperative and appears stated age Head: Normocephalic, without obvious abnormality, atraumatic Eyes: conjunctiva are clear, EOMi, PEERLA Ears: normal TM's and external ear canals both ears Nose: Nares normal. Septum midline. Mucosa normal. No drainage or sinus tenderness.she does have an ulceation lesion over the inside of the Rt nares that has been there for a month and not healing  Throat: lips, mucosa, and tongue normal; teeth and gums normal Neck: no adenopathy, supple, symmetrical, trachea midline and thyroid not enlarged, symmetric, no tenderness/mass/nodules Back: symmetric, no curvature. ROM normal. No CVA tenderness.  Abd: normal BS, soft, NT, no  OG Pelvi: Norma exam, cervix is erythematous. Small bloody discharge at the os. No palpable masses. Unable to palpate the ovaries Breast: No masses or lesions or rash Skin: Pink oval erythematous scaling lesion below her LT eyebrow. Scraping sent for KOH.  LE: No edema Neuro: CN 2-12 intact, gross motor intact, gait is normal.    Assessment:    Healthy female exam.      Plan:     See After Visit Summary for Counseling Recommendations  Start a regular exercise program and make sure you are eating a healthy diet Try to eat 4 servings of dairy a day or take a calcium supplement (500mg  twice a day). Your vaccines are up to date.   Nasal ulcer - Tx with bactroban and if not clear in one week then call Rash over her eye - likely eczema but will send KOH to rule out fungal elements Lab slip given for screening labs Flu shot given today.

## 2010-08-30 NOTE — Telephone Encounter (Signed)
Pt called back and said she needed an alternative medication for her kariva BCP.  Meant to mention while she was at her appt today, but forgot.  Pt will call her insurance company anf find out what is tier one and call back and let the triage nurse know. Jarvis Newcomer, LPN [facility]

## 2010-08-30 NOTE — Telephone Encounter (Signed)
Pt called and said she was seen today, and was given a script (?bactroban generic ointment 2%), and it is a tier 2 on her insurance plan.  Pt wants a diff med prescribed that would be a tier 1. Plan:  Pt notified and had to [facility] telling her that she will need to call her insurance plan and ask them what would be a tier 1 on her insurance formulary.  Pending pt call back. Jarvis Newcomer, LPN [facility]

## 2010-08-31 ENCOUNTER — Telehealth: Payer: Self-pay | Admitting: Family Medicine

## 2010-08-31 LAB — LIPID PANEL
Cholesterol: 197 mg/dL (ref 0–200)
HDL: 57 mg/dL (ref >39)
LDL Cholesterol: 125 mg/dL — ABNORMAL HIGH (ref 0–99)
Total CHOL/HDL Ratio: 3.5 Ratio
Triglycerides: 76 mg/dL (ref <150)

## 2010-08-31 LAB — KOH PREP: RESULT - KOH: NONE SEEN

## 2010-08-31 LAB — COMPLETE METABOLIC PANEL WITH GFR
Alkaline Phosphatase: 40 U/L (ref 39–117)
BUN: 9 mg/dL (ref 6–23)
GFR, Est African American: >60 mL/min (ref >60)
Total Bilirubin: 0.3 mg/dL (ref 0.3–1.2)

## 2010-08-31 MED ORDER — TRIAMCINOLONE ACETONIDE 0.025 % EX LOTN: TOPICAL_LOTION | CUTANEOUS | Status: DC

## 2010-08-31 MED ORDER — NORETHINDRONE-MESTRANOL 1-50 MG-MCG PO TABS: 1.0000 | ORAL_TABLET | Freq: Every day | ORAL | Status: DC

## 2010-08-31 NOTE — Telephone Encounter (Signed)
Call pt: CMP is nl.  Chol LDL is up just a little but much better this year!  Great work.  Recheck in 1 yr.

## 2010-08-31 NOTE — Telephone Encounter (Signed)
Pt notified that Necon prescription was sent to her pharmacy. Jarvis Newcomer, LPN [facility]

## 2010-08-31 NOTE — Telephone Encounter (Signed)
rx sent

## 2010-08-31 NOTE — Telephone Encounter (Signed)
Pt notified of results. KJ LPN 

## 2010-08-31 NOTE — Telephone Encounter (Signed)
Call pt: scraping is neg for fungal elements. Will tx with topical steroid. Dont' get in eye. Can apply once a day for 10 days.

## 2010-09-01 ENCOUNTER — Telehealth: Payer: Self-pay | Admitting: Family Medicine

## 2010-09-01 NOTE — Telephone Encounter (Signed)
Call pt: Pap is normal. Repeat in 1 years.

## 2010-09-01 NOTE — Telephone Encounter (Signed)
Pt notified of results. KJ LPN 

## 2010-09-04 NOTE — Telephone Encounter (Signed)
Pt notified of results. KJ LPN 

## 2010-11-06 ENCOUNTER — Ambulatory Visit (INDEPENDENT_AMBULATORY_CARE_PROVIDER_SITE_OTHER): Payer: 59 | Admitting: Family Medicine

## 2010-11-06 ENCOUNTER — Encounter: Payer: Self-pay | Admitting: Family Medicine

## 2010-11-06 DIAGNOSIS — J019 Acute sinusitis, unspecified: Secondary | ICD-10-CM

## 2010-11-06 DIAGNOSIS — J069 Acute upper respiratory infection, unspecified: Secondary | ICD-10-CM

## 2010-11-06 MED ORDER — AZITHROMYCIN 250 MG PO TABS: ORAL_TABLET | ORAL | Status: AC

## 2010-11-06 NOTE — Progress Notes (Signed)
  Subjective:    Patient ID: Breanna Mcmillan, female    DOB: 16-Nov-1985, 25 y.o.   MRN: [id number]  HPI Sinus sxs for over a week and now feels like into her chest. Cough is barking and dry. Getting burning in her chest.  Using her inhaler every 4-6 hours starting yesterday. No fever.  No GI sxs. Still with nasal congestion.  HA initially but non today.  Usually tries to let it run it course.  Took some IBU and mucinex - didn't really seem to help.  She hs been taking allergy meds. Husband has been sick as well.    Review of Systems     Objective:   Physical Exam  Constitutional: She is oriented to person, place, and time. She appears well-developed and well-nourished.  HENT:  Head: Normocephalic and atraumatic.  Right Ear: External ear normal.  Left Ear: External ear normal.  Nose: Nose normal.  Mouth/Throat: Oropharynx is clear and moist.       TMs and canals are clear.   Eyes: Conjunctivae and EOM are normal. Pupils are equal, round, and reactive to light.  Neck: Neck supple. No thyromegaly present.  Cardiovascular: Normal rate, regular rhythm and normal heart sounds.   Pulmonary/Chest: Effort normal and breath sounds normal. She has no wheezes.  Lymphadenopathy:    She has no cervical adenopathy.  Neurological: She is alert and oriented to person, place, and time.  Skin: Skin is warm and dry.  Psychiatric: She has a normal mood and affect.          Assessment & Plan:  URI - Will tx for acute sinusitis since has been sick over a week but could still be viral. Will call in zpack. Symptomatic care. Call if would like cough med at bedtime. Can use OTC products.

## 2011-01-29 ENCOUNTER — Encounter: Payer: Self-pay | Admitting: Physician Assistant

## 2011-01-29 ENCOUNTER — Ambulatory Visit (INDEPENDENT_AMBULATORY_CARE_PROVIDER_SITE_OTHER): Payer: BC Managed Care – PPO | Admitting: Physician Assistant

## 2011-01-29 VITALS — BP 129/81 | HR 119 | Temp 98.2°F | Ht 66.0 in | Wt 149.0 lb

## 2011-01-29 DIAGNOSIS — K1120 Sialoadenitis, unspecified: Secondary | ICD-10-CM

## 2011-01-29 DIAGNOSIS — J029 Acute pharyngitis, unspecified: Secondary | ICD-10-CM

## 2011-01-29 LAB — POCT RAPID STREP A (OFFICE): Rapid Strep A Screen: NEGATIVE

## 2011-01-29 NOTE — Patient Instructions (Signed)
Continue salt water gargles. Symptomatic care. Handout given. Call back if not better by Friday I will call in antibiotic. Can try mucinex twice a day for congestion.   Parotitis Parotitis is an inflammation of one or both parotid glands. This is the main salivary gland. It lies behind the angle of the jaw and below the ear lobe. The saliva produced comes out of a tiny opening (duct) inside the cheek on either side. It is usually at the level of the upper back teeth. If the parotid gland is swollen, the ear is pushed up and out in a particular way. This helps set this condition apart from a simple lymph gland infection in the same area. CAUSES  Cases of mumps have mostly disappeared since the start of immunization against mumps. Currently, the most common causes of parotitis are:  Germ (bacterial) infection.   Inflammation of the lymph channels (lymphatics).  Other Uncommon Causes of Parotitis:  Sjogren's syndrome. A condition in which arthritis is associated with a decrease in activity of the glands of the body that produce saliva and tears. Some people are bothered by a dry mouth and intermittent salivary gland enlargement. The diagnosis is made with blood tests or by examination of a piece of tissue from the inside of the lip.   Atypical mycobacteria. Can give rise to a condition that usually infects children. It is a germ similar to tuberculosis. It is often resistant to antibiotic treatment. It may require surgical treatment and removal of the infected salivary gland.   Actinomycosis. An infection of the parotid gland that may also involve the overlying skin. The diagnosis is made by detecting granules of sulphur, produced by the bacteria, on microscopic examination. Treatment is with a prolonged course of penicillin for up to one year.  Acute (Sudden Onset) Bacertial Parotitis This is a sudden inflammatory response to bacterial infection that causes:  Redness (erythema).   Pain.    Swelling.   Tenderness over the gland on the side of the cheek.   The appearance of pus from the opening of the duct on the inside of the cheek.  It used to be common in dehydrated and debilitated patients, and often following surgery. It is now more commonly seen after radiotherapy (X-ray treatment) or in patients with a poorly working immune system. Treatment includes:   Correction of the lack of fluids (rehydration).   Medications which kill germs(antibiotics).   Pain relief.  Chronic Recurrent Parotitis This refers to repeated episodes of discomfort and swelling of the parotid gland. This occurs often after eating. It is caused by decreased flow of saliva. This is often due to either blockage of the duct by a stone or the formation of a duct narrowing. It is treated with:   Gland massage.   Methods to stimulate the flow of saliva (for example, giving lemon juice).   Antibiotics if required.  Surgery to remove the gland is possible. The benefits of surgery need to be balanced against the risk of damage to the facial nerve. The facial nerve allows the muscles of facial expression to function. Damage to this can cause paralysis of one side of the face. X-ray treatment (radiotherapy) and treatment with steroid tablets have been considered. But they are thought to be ineffective. Viral Parotitis The most common viral cause of parotitis is mumps. It usually affects 4 to 10 year olds. It causes painful swelling of both parotid glands. Recurrent Parotitis in Children This condition is thought to be due to  swelling or ballooning of the ducts (ectasia). It results in the same problems(symptoms ) as acute bacterial parotitis. It is usually caused by bacteria called streptococci that is treated with penicillin. It usually gets well by itself without treatment (self-limiting). Surgery is usually not needed. Tuberculosis The parotid glands may become infected with the same bacteria causing  tuberculosis (TB). Treatment is with anti-tuberculous antibiotic therapy. HOME CARE INSTRUCTIONS   Apply ice bags about every 2 hours, for 15 to 20 minutes, while awake, to the sore gland. Place ice in a plastic bag with a towel around it to prevent frostbite to skin. Continue for 24 hours and then as directed by your caregiver.   Only take over-the-counter or prescription medicines for pain, discomfort, or fever as directed by your caregiver.  SEEK IMMEDIATE MEDICAL CARE IF:   There is increased pain or swelling in your gland that is not controlled with medication.   You have a fever.  Document Released: 06/16/2001 Document Revised: 09/06/2010 Document Reviewed: 12/25/2004 [facility] Patient Information 2012 [place], [place].

## 2011-01-29 NOTE — Progress Notes (Signed)
  Subjective:    Patient ID: Breanna Mcmillan, female    DOB: [birth year], 26 y.o.   MRN: [id number]  Sore Throat  This is a new problem. The current episode started in the past 7 days. There has been no fever. The pain is at a severity of 5/10. Associated symptoms include coughing, ear pain and swollen glands. Pertinent negatives include no abdominal pain, congestion, diarrhea, drooling, ear discharge, headaches, neck pain, shortness of breath, trouble swallowing or vomiting. She has had no exposure to strep. She has tried gargles and acetaminophen for the symptoms. The treatment provided mild relief.     Review of Systems  HENT: Positive for ear pain. Negative for congestion, drooling, trouble swallowing, neck pain and ear discharge.   Respiratory: Positive for cough. Negative for shortness of breath.   Gastrointestinal: Negative for vomiting, abdominal pain and diarrhea.  Neurological: Negative for headaches.       Objective:   Physical Exam  Constitutional: She is oriented to person, place, and time. She appears well-developed and well-nourished.  HENT:  Head: Normocephalic and atraumatic.  Right Ear: External ear normal.  Left Ear: External ear normal.  Nose: Nose normal.  Mouth/Throat: No oropharyngeal exudate.       Oropharynx with erythema present. NO maxillary tenderness.   Eyes: Right eye exhibits no discharge. Left eye exhibits no discharge.  Neck: Normal range of motion. Neck supple.  Cardiovascular: Regular rhythm and normal heart sounds.        Tachycardia at 119.   Pulmonary/Chest: Effort normal and breath sounds normal.  Lymphadenopathy:    She has no cervical adenopathy.  Neurological: She is alert and oriented to person, place, and time.  Skin: Skin is warm and dry.       2mm enlarged lymphnode on the left jaw line that was tender to palpation. Minimal swelling and erythema present surrounding lymphnode.   Psychiatric: She has a normal mood and affect. Her behavior  is normal.          Assessment & Plan:  Sore throat- Negative Rapid Strep. Symptomatic Care to continue with salt water gargles and honey. Can use Mucinex safetly in pregnancy for any sinus congestion.   Parotitis- Instructed her to suck on lemon drops and gave her a handout on symptomatic treatment. If not improving or getting worse I told her to call in on Friday 02/02/11 and I would call her in a prescription for bacterial parotitis.   NOTE: Patient is [redacted] weeks pregnant.

## 2011-11-20 ENCOUNTER — Other Ambulatory Visit: Payer: Self-pay | Admitting: Family Medicine

## 2013-11-09 ENCOUNTER — Encounter: Payer: Self-pay | Admitting: Physician Assistant

## 2014-02-15 ENCOUNTER — Encounter: Payer: Self-pay | Admitting: Physician Assistant

## 2014-02-15 ENCOUNTER — Ambulatory Visit (INDEPENDENT_AMBULATORY_CARE_PROVIDER_SITE_OTHER): Payer: BLUE CROSS/BLUE SHIELD | Admitting: Physician Assistant

## 2014-02-15 VITALS — BP 130/90 | HR 84 | Ht 66.0 in | Wt 158.0 lb

## 2014-02-15 DIAGNOSIS — D3191 Benign neoplasm of unspecified part of right eye: Secondary | ICD-10-CM | POA: Diagnosis not present

## 2014-02-15 DIAGNOSIS — L03115 Cellulitis of right lower limb: Secondary | ICD-10-CM

## 2014-02-15 MED ORDER — DOXYCYCLINE HYCLATE 100 MG PO TABS: 100.0000 mg | ORAL_TABLET | Freq: Two times a day (BID) | ORAL | Status: DC

## 2014-02-15 NOTE — Progress Notes (Signed)
   Subjective:    Patient ID: Breanna Mcmillan, female    DOB: October 01, 1985, 29 y.o.   MRN: [id number]  HPI  Patient is a 29 year old female who presents to the clinic with a small pimple that seems to be expanding over the last 24 hours. She noticed a pimple-like formation yesterday. The area of redness and pain has continued to increase over the past 24 hours. She has not done anything to make better. It seems to be getting worse on his own. She does feel somewhat "BLah" but denies any fever, chills, nausea, vomiting or abdominal pain. She does not remember any bug bite or trauma. She also does have a small growth on her right upper eyelid. She's had this for the last year. There is no pain or tenderness or itching associated. She's not tried anything to make better. She does feel like it could be slightly increasing in size and figures it might start obstructing her vision. She didn't notice it occurred with her first pregnancy.     Review of Systems  All other systems reviewed and are negative.      Objective:   Physical Exam  Constitutional: She is oriented to person, place, and time. She appears well-developed and well-nourished.  HENT:  Head: Normocephalic and atraumatic.  Cardiovascular: Normal rate, regular rhythm and normal heart sounds.   Pulmonary/Chest: Effort normal and breath sounds normal. She has no wheezes.  Neurological: She is alert and oriented to person, place, and time.  Skin:     Psychiatric: She has a normal mood and affect. Her behavior is normal.          Assessment & Plan:  Cellulitis- unclear etiology could be ingrown hair or bug bite. treated with doxycycline for 10 days. Gave handout on other symptomatic care. Encouraged patient to keep leg elevated as much as possible. Tylenol and Motrin for discomfort. Call if the like increasing in size or not improving.   Skin growth on right upper eyelid- appears benign but if continues to grow may start to  obstruct vision. Due to location I will refer to dermatology for possible treatments to help shrink growth or remove.

## 2014-02-15 NOTE — Patient Instructions (Signed)

## 2014-02-16 ENCOUNTER — Ambulatory Visit: Payer: Self-pay | Admitting: Family Medicine

## 2014-04-17 ENCOUNTER — Emergency Department (INDEPENDENT_AMBULATORY_CARE_PROVIDER_SITE_OTHER)
Admission: EM | Admit: 2014-04-17 | Discharge: 2014-04-17 | Disposition: A | Discharge status: Home / Self Care | Payer: BLUE CROSS/BLUE SHIELD | Source: Home / Self Care | Attending: Emergency Medicine | Admitting: Emergency Medicine

## 2014-04-17 ENCOUNTER — Encounter: Payer: Self-pay | Admitting: *Deleted

## 2014-04-17 DIAGNOSIS — R319 Hematuria, unspecified: Secondary | ICD-10-CM | POA: Diagnosis not present

## 2014-04-17 DIAGNOSIS — N926 Irregular menstruation, unspecified: Secondary | ICD-10-CM | POA: Diagnosis not present

## 2014-04-17 LAB — POCT URINALYSIS DIP (MANUAL ENTRY)
Bilirubin, UA: NEGATIVE
Glucose, UA: NEGATIVE
Ketones, POC UA: NEGATIVE
Leukocytes, UA: NEGATIVE
Nitrite, UA: NEGATIVE
Protein Ur, POC: NEGATIVE
Spec Grav, UA: 1.01
Urobilinogen, UA: 0.2
pH, UA: 6.5

## 2014-04-17 LAB — POCT URINE PREGNANCY: Preg Test, Ur: POSITIVE

## 2014-04-17 NOTE — ED Provider Notes (Signed)
CSN: [id number]     Arrival date & time 04/17/14  0913 History   First MD Initiated Contact with Patient 04/17/14 [phone]     Chief Complaint  Patient presents with  . Hematuria    HPI This is a 29 y.o. female who presents today with slight blood in urine today. She states she is not sure if it's from blood in urine or from menstrual blood. Last menstrual period started about 12 days ago, then stopped completely 2 days ago. Then, this morning had slight blood in urine. Feels some mild suprapubic and right lower quadrant mild menstrual crampy pain without radiation. Appetite normal. No nausea, vomiting, diarrhea, fever, chills or cardiorespiratory symptoms. She is sexually active. 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 She feels fatigued from overwork, but does not feel sick. No dysuria No frequency No urgency Positive hematuria No vaginal discharge No fever/chills No nausea No vomiting Minimal vague, back pain  Has not tried any particular treatment.Angus Seller of Review of Systems negative for acute change except as noted in the HPI.     History reviewed. No pertinent past medical history. History reviewed. No pertinent past surgical history. Family History  Problem Relation Age of Onset  . Depression Mother   . Hypertension Father   . Diabetes Paternal Grandfather    History  Substance Use Topics  . Smoking status: Never Smoker   . Smokeless tobacco: Never Used  . Alcohol Use: Yes   OB History    Gravida Para Term Preterm AB TAB SAB Ectopic Multiple Living   1              Review of Systems  Allergies  Vicodin  Home Medications   Prior to Admission medications   Medication Sig Start Date End Date Taking? Authorizing Provider  doxycycline (VIBRA-TABS) 100 MG tablet Take 1 tablet (100 mg total) by mouth 2 (two) times daily. For 10 days. 02/15/14   Jade L Breeback, PA-C   BP 121/82 mmHg  Pulse 98  Temp(Src)  97.9 F (36.6 C) (Oral)  Ht 5\' 6"  (1.676 m)  Wt 164 lb (74.39 kg)  BMI 26.48 kg/m2  SpO2 99%  LMP 04/12/2014 Physical Exam  Constitutional: She is oriented to person, place, and time. She appears well-developed and well-nourished. No distress.  HENT:  Mouth/Throat: Oropharynx is clear and moist.  Eyes: No scleral icterus.  Neck: Neck supple.  Cardiovascular: Normal rate, regular rhythm and normal heart sounds.   Pulmonary/Chest: Breath sounds normal.  Abdominal: Soft. She exhibits no mass. There is no hepatosplenomegaly. There is tenderness (Minimal suprapubic and right lower quadrant tenderness without masses guarding or rebound.). There is no rigidity, no rebound, no guarding, no CVA tenderness, no tenderness at McBurney's point and negative Murphy's sign.  Lymphadenopathy:    She has no cervical adenopathy.  Neurological: She is alert and oriented to person, place, and time.  Skin: Skin is warm and dry.  Nursing note and vitals reviewed.   ED Course  Procedures (including critical care time) Labs Review Labs Reviewed  URINE CULTURE  HCG, QUANTITATIVE, PREGNANCY  POCT URINALYSIS DIP (MANUAL ENTRY)  POCT URINE PREGNANCY    Results for orders placed or performed during the hospital encounter of 04/17/14  POCT urinalysis dipstick (new)  Result Value Ref Range   Color, UA yellow    Clarity, UA clear    Glucose, UA neg    Bilirubin, UA  negative    Bilirubin, UA negative    Spec Grav, UA 1.010    Blood, UA moderate    pH, UA 6.5    Protein Ur, POC negative    Urobilinogen, UA 0.2    Nitrite, UA Negative    Leukocytes, UA Negative   POCT urine pregnancy  Result Value Ref Range   Preg Test, Ur Positive       Imaging Review No results found.   MDM   1. Irregular menstrual bleeding   2. Hematuria   Over 25 minutes spent, greater than 50% of the time spent for counseling. Explained urine pregnancy test positive. Most likely, the scant blood in urine is from  this. No evidence of UTI, but we'll send off urine culture. Other advice given. Precautions discussed and red flags discussed. If any red flags, go to ER stat and call her OB/GYN. Start taking prenatal vitamins. Follow-up your OB GYN at [facility] within 3 days. Quantitative serum pregnancy/hCG test drawn. Questions invited and answered. She voiced understanding and agreement with the above. She declined any other testing today.    Jacqulyn Cane, MD 04/17/14 1013

## 2014-04-17 NOTE — ED Notes (Signed)
Pt states her period has lasted longer than normal and she is still having some blood in her urine.  She denies painful urination but is experiencing lower abdominal cramping.  Denies increased urgency or frequency but states she has been working a lot and maybe didn't notice.

## 2014-04-19 ENCOUNTER — Telehealth: Payer: Self-pay | Admitting: Emergency Medicine

## 2014-04-19 LAB — URINE CULTURE

## 2014-04-20 ENCOUNTER — Telehealth: Payer: Self-pay | Admitting: Emergency Medicine

## 2014-04-20 LAB — HCG, QUANTITATIVE, PREGNANCY: hCG, Beta Chain, Quant, S: 964.6 m[IU]/mL

## 2015-06-03 ENCOUNTER — Encounter: Payer: Self-pay | Admitting: Family Medicine

## 2015-06-03 ENCOUNTER — Ambulatory Visit (INDEPENDENT_AMBULATORY_CARE_PROVIDER_SITE_OTHER): Payer: BLUE CROSS/BLUE SHIELD | Admitting: Family Medicine

## 2015-06-03 ENCOUNTER — Other Ambulatory Visit (HOSPITAL_COMMUNITY)
Admission: RE | Admit: 2015-06-03 | Discharge: 2015-06-03 | Disposition: A | Discharge status: Home / Self Care | Payer: BLUE CROSS/BLUE SHIELD | Source: Ambulatory Visit | Attending: Family Medicine | Admitting: Family Medicine

## 2015-06-03 VITALS — BP 139/78 | HR 103 | Wt 167.0 lb

## 2015-06-03 DIAGNOSIS — R21 Rash and other nonspecific skin eruption: Secondary | ICD-10-CM | POA: Diagnosis not present

## 2015-06-03 DIAGNOSIS — Z01419 Encounter for gynecological examination (general) (routine) without abnormal findings: Secondary | ICD-10-CM

## 2015-06-03 DIAGNOSIS — N926 Irregular menstruation, unspecified: Secondary | ICD-10-CM | POA: Diagnosis not present

## 2015-06-03 DIAGNOSIS — Z1151 Encounter for screening for human papillomavirus (HPV): Secondary | ICD-10-CM | POA: Insufficient documentation

## 2015-06-03 DIAGNOSIS — Z23 Encounter for immunization: Secondary | ICD-10-CM | POA: Diagnosis not present

## 2015-06-03 NOTE — Patient Instructions (Signed)
Keep up a regular exercise program and make sure you are eating a healthy diet Try to eat 4 servings of dairy a day, or if you are lactose intolerant take a calcium with vitamin D daily.  Your vaccines are up to date.   

## 2015-06-03 NOTE — Progress Notes (Signed)
Subjective:     Breanna Mcmillan is a 30 y.o. female and is here for a comprehensive physical exam. The patient reports problems - Periods have become more infrequent Since she had an ectopic pregnancy in 2016 .She would actually like to get tested for polycystic ovary. Last period was about a month ago.He does complain of some facial hair, but it is mild. She has gained some weight over the last couple years and just can't seem to lose it.  She also reports a rash on her chest underneath her breasts. It started with one spot and now it several different spots. She says it occasionally will itch if she gets really hot or sweaty. She tried over-the-counter Lamisil cream but it did not seem to resolve the rash.  Social History   Social History  . Marital Status: Married    Spouse Name: micheal  . Number of Children: N/A  . Years of Education: N/A   Occupational History  . Glass blower/designer.    Social History Main Topics  . Smoking status: Never Smoker   . Smokeless tobacco: Never Used  . Alcohol Use: Yes  . Drug Use: Not on file  . Sexual Activity:    Partners: Male   Other Topics Concern  . Not on file   Social History Narrative   Father with IMGUS (possible multiple myeloma).  Occ exercise.  1 caffeine drink per day.    Health Maintenance  Topic Date Due  . HIV Screening  09/06/1999  . PAP SMEAR  08/29/2013  . TETANUS/TDAP  01/09/2015  . INFLUENZA VACCINE  08/09/2015    The following portions of the patient's history were reviewed and updated as appropriate: allergies, current medications, past family history, past medical history, past social history, past surgical history and problem list.  Review of Systems A comprehensive review of systems was negative.   Objective:    BP 139/78 mmHg  Pulse 103  Wt 167 lb (75.751 kg)  SpO2 99%  LMP 04/27/2015 General appearance: alert, cooperative and appears stated age Head: Normocephalic, without obvious abnormality,  atraumatic Eyes: conj clear, EOMI, PEERLA Ears: normal TM's and external ear canals both ears Nose: Nares normal. Septum midline. Mucosa normal. No drainage or sinus tenderness. Throat: lips, mucosa, and tongue normal; teeth and gums normal Neck: no adenopathy, no carotid bruit, no JVD, supple, symmetrical, trachea midline and thyroid not enlarged, symmetric, no tenderness/mass/nodules Back: symmetric, no curvature. ROM normal. No CVA tenderness. Lungs: clear to auscultation bilaterally Breasts: normal appearance, no masses or tenderness Heart: regular rate and rhythm, S1, S2 normal, no murmur, click, rub or gallop Abdomen: soft, non-tender; bowel sounds normal; no masses,  no organomegaly Pelvic: cervix normal in appearance, external genitalia normal, no adnexal masses or tenderness, no cervical motion tenderness, rectovaginal septum normal, uterus normal size, shape, and consistency and vagina normal without discharge Extremities: extremities normal, atraumatic, no cyanosis or edema Pulses: 2+ and symmetric Skin: rash under both breasts that are salmon colored and oval in shape, 1-4 cm in size Lymph nodes: Cervical, supraclavicular, and axillary nodes normal. Neurologic: Alert and oriented X 3, normal strength and tone. Normal symmetric reflexes. Normal coordination and gait    Assessment:    Healthy female exam.      Plan:     See After Visit Summary for Counseling Recommendations  Keep up a regular exercise program and make sure you are eating a healthy diet Try to eat 4 servings of dairy a day, or if  you are lactose intolerant take a calcium with vitamin D daily.  Your vaccines are up to date.   Abnormal periods - will check labs and schedule for [place].  LMP was 04/27/15.  We'll do further workup for possible PCO S. Will do additional blood work and schedule for pelvic ultrasound. She's also been trying to get pregnant for the most 15 months. She is taking a prenatal vitamin and  encouraged her to continue that. If workup is negative then she might even want to consider getting in with a gynecologist for infertility workup.  Rash-most consistent with tinea versicolor. Skin scraping performed. Will call with results once available.

## 2015-06-06 LAB — FUNGAL STAIN

## 2015-06-07 ENCOUNTER — Other Ambulatory Visit: Payer: Self-pay | Admitting: Family Medicine

## 2015-06-07 MED ORDER — KETOCONAZOLE 2 % EX CREA
1.0000 | TOPICAL_CREAM | Freq: Two times a day (BID) | CUTANEOUS | Status: DC
Start: 2015-06-07 — End: 2017-06-20

## 2015-06-07 NOTE — Addendum Note (Signed)
Addended by: Beatrice Lecher D on: 06/07/2015 07:57 AM   Modules accepted: Orders, SmartSet

## 2015-06-08 LAB — COMPLETE METABOLIC PANEL WITH GFR
ALT: 11 U/L (ref 6–29)
AST: 12 U/L (ref 10–30)
Albumin: 4.3 g/dL (ref 3.6–5.1)
Alkaline Phosphatase: 41 U/L (ref 33–115)
BUN: 11 mg/dL (ref 7–25)
CALCIUM: 8.9 mg/dL (ref 8.6–10.2)
CHLORIDE: 101 mmol/L (ref 98–110)
CO2: 23 mmol/L (ref 20–31)
Creat: 0.65 mg/dL (ref 0.50–1.10)
GFR, Est African American: >89 mL/min (ref >=60)
GFR, Est Non African American: >89 mL/min (ref >=60)
Glucose, Bld: 86 mg/dL (ref 65–99)
POTASSIUM: 4 mmol/L (ref 3.5–5.3)
SODIUM: 136 mmol/L (ref 135–146)
Total Bilirubin: 0.3 mg/dL (ref 0.2–1.2)
Total Protein: 6.7 g/dL (ref 6.1–8.1)

## 2015-06-08 LAB — FOLLICLE STIMULATING HORMONE: FSH: 2.3 m[IU]/mL

## 2015-06-08 LAB — LUTEINIZING HORMONE: LH: 4.3 m[IU]/mL

## 2015-06-08 LAB — LIPID PANEL
CHOL/HDL RATIO: 4.5 ratio (ref <=5.0)
CHOLESTEROL: 162 mg/dL (ref 125–200)
HDL: 36 mg/dL — AB (ref >=46)
LDL Cholesterol: 90 mg/dL (ref <130)
Triglycerides: 178 mg/dL — ABNORMAL HIGH (ref <150)
VLDL: 36 mg/dL — AB (ref <30)

## 2015-06-08 LAB — ESTRADIOL: ESTRADIOL: 119 pg/mL

## 2015-06-08 LAB — TSH: TSH: 4.5 mIU/L

## 2015-06-08 LAB — TESTOSTERONE: Testosterone: 100 ng/dL

## 2015-06-08 LAB — PROGESTERONE: PROGESTERONE: 3.2 ng/mL

## 2015-06-09 LAB — CYTOLOGY - PAP

## 2015-06-10 LAB — T4, FREE: FREE T4: 1 ng/dL (ref 0.8–1.8)

## 2015-07-01 ENCOUNTER — Other Ambulatory Visit: Payer: BLUE CROSS/BLUE SHIELD

## 2015-07-05 ENCOUNTER — Ambulatory Visit (INDEPENDENT_AMBULATORY_CARE_PROVIDER_SITE_OTHER): Payer: BLUE CROSS/BLUE SHIELD

## 2015-07-05 DIAGNOSIS — N926 Irregular menstruation, unspecified: Secondary | ICD-10-CM | POA: Diagnosis not present

## 2015-11-24 DIAGNOSIS — Z3200 Encounter for pregnancy test, result unknown: Secondary | ICD-10-CM | POA: Diagnosis not present

## 2016-01-13 DIAGNOSIS — Z8759 Personal history of other complications of pregnancy, childbirth and the puerperium: Secondary | ICD-10-CM | POA: Diagnosis not present

## 2016-01-16 DIAGNOSIS — Z8759 Personal history of other complications of pregnancy, childbirth and the puerperium: Secondary | ICD-10-CM | POA: Diagnosis not present

## 2016-01-18 DIAGNOSIS — O039 Complete or unspecified spontaneous abortion without complication: Secondary | ICD-10-CM | POA: Diagnosis not present

## 2016-01-18 DIAGNOSIS — Z8759 Personal history of other complications of pregnancy, childbirth and the puerperium: Secondary | ICD-10-CM | POA: Diagnosis not present

## 2016-01-31 DIAGNOSIS — Z8759 Personal history of other complications of pregnancy, childbirth and the puerperium: Secondary | ICD-10-CM | POA: Diagnosis not present

## 2016-01-31 DIAGNOSIS — O039 Complete or unspecified spontaneous abortion without complication: Secondary | ICD-10-CM | POA: Diagnosis not present

## 2016-02-02 DIAGNOSIS — Z3A00 Weeks of gestation of pregnancy not specified: Secondary | ICD-10-CM | POA: Diagnosis not present

## 2016-02-02 DIAGNOSIS — O009 Unspecified ectopic pregnancy without intrauterine pregnancy: Secondary | ICD-10-CM | POA: Diagnosis not present

## 2016-02-06 DIAGNOSIS — O021 Missed abortion: Secondary | ICD-10-CM | POA: Diagnosis not present

## 2016-02-09 DIAGNOSIS — O039 Complete or unspecified spontaneous abortion without complication: Secondary | ICD-10-CM | POA: Diagnosis not present

## 2016-02-16 DIAGNOSIS — O039 Complete or unspecified spontaneous abortion without complication: Secondary | ICD-10-CM | POA: Diagnosis not present

## 2016-02-23 DIAGNOSIS — O039 Complete or unspecified spontaneous abortion without complication: Secondary | ICD-10-CM | POA: Diagnosis not present

## 2016-03-01 DIAGNOSIS — Z8759 Personal history of other complications of pregnancy, childbirth and the puerperium: Secondary | ICD-10-CM | POA: Diagnosis not present

## 2016-03-08 DIAGNOSIS — O039 Complete or unspecified spontaneous abortion without complication: Secondary | ICD-10-CM | POA: Diagnosis not present

## 2016-03-28 DIAGNOSIS — N96 Recurrent pregnancy loss: Secondary | ICD-10-CM | POA: Diagnosis not present

## 2016-04-25 DIAGNOSIS — N736 Female pelvic peritoneal adhesions (postinfective): Secondary | ICD-10-CM | POA: Diagnosis not present

## 2016-04-25 DIAGNOSIS — Z79899 Other long term (current) drug therapy: Secondary | ICD-10-CM | POA: Diagnosis not present

## 2016-04-25 DIAGNOSIS — N801 Endometriosis of ovary: Secondary | ICD-10-CM | POA: Diagnosis not present

## 2016-04-25 DIAGNOSIS — N802 Endometriosis of fallopian tube: Secondary | ICD-10-CM | POA: Diagnosis not present

## 2016-04-25 DIAGNOSIS — N803 Endometriosis of pelvic peritoneum: Secondary | ICD-10-CM | POA: Diagnosis not present

## 2016-04-25 DIAGNOSIS — N96 Recurrent pregnancy loss: Secondary | ICD-10-CM | POA: Diagnosis not present

## 2016-04-25 DIAGNOSIS — J45909 Unspecified asthma, uncomplicated: Secondary | ICD-10-CM | POA: Diagnosis not present

## 2016-04-25 DIAGNOSIS — Z885 Allergy status to narcotic agent status: Secondary | ICD-10-CM | POA: Diagnosis not present

## 2016-04-25 DIAGNOSIS — R102 Pelvic and perineal pain: Secondary | ICD-10-CM | POA: Diagnosis not present

## 2016-08-14 DIAGNOSIS — L814 Other melanin hyperpigmentation: Secondary | ICD-10-CM | POA: Diagnosis not present

## 2016-08-14 DIAGNOSIS — L579 Skin changes due to chronic exposure to nonionizing radiation, unspecified: Secondary | ICD-10-CM | POA: Diagnosis not present

## 2016-08-20 DIAGNOSIS — H029 Unspecified disorder of eyelid: Secondary | ICD-10-CM | POA: Diagnosis not present

## 2016-08-28 DIAGNOSIS — Z3201 Encounter for pregnancy test, result positive: Secondary | ICD-10-CM | POA: Diagnosis not present

## 2016-08-30 DIAGNOSIS — Z8759 Personal history of other complications of pregnancy, childbirth and the puerperium: Secondary | ICD-10-CM | POA: Diagnosis not present

## 2016-09-03 DIAGNOSIS — Z3201 Encounter for pregnancy test, result positive: Secondary | ICD-10-CM | POA: Diagnosis not present

## 2016-09-05 DIAGNOSIS — Z8759 Personal history of other complications of pregnancy, childbirth and the puerperium: Secondary | ICD-10-CM | POA: Diagnosis not present

## 2016-09-06 DIAGNOSIS — O3680X0 Pregnancy with inconclusive fetal viability, not applicable or unspecified: Secondary | ICD-10-CM | POA: Diagnosis not present

## 2016-09-25 DIAGNOSIS — Z3A08 8 weeks gestation of pregnancy: Secondary | ICD-10-CM | POA: Diagnosis not present

## 2016-09-25 DIAGNOSIS — Z3481 Encounter for supervision of other normal pregnancy, first trimester: Secondary | ICD-10-CM | POA: Diagnosis not present

## 2016-09-25 DIAGNOSIS — Z3480 Encounter for supervision of other normal pregnancy, unspecified trimester: Secondary | ICD-10-CM | POA: Diagnosis not present

## 2016-10-23 DIAGNOSIS — Z3682 Encounter for antenatal screening for nuchal translucency: Secondary | ICD-10-CM | POA: Diagnosis not present

## 2016-10-23 DIAGNOSIS — Z3A12 12 weeks gestation of pregnancy: Secondary | ICD-10-CM | POA: Diagnosis not present

## 2016-11-05 DIAGNOSIS — O4691 Antepartum hemorrhage, unspecified, first trimester: Secondary | ICD-10-CM | POA: Diagnosis not present

## 2016-11-05 DIAGNOSIS — Z3A13 13 weeks gestation of pregnancy: Secondary | ICD-10-CM | POA: Diagnosis not present

## 2016-11-20 DIAGNOSIS — Z23 Encounter for immunization: Secondary | ICD-10-CM | POA: Diagnosis not present

## 2016-11-20 DIAGNOSIS — Z3A16 16 weeks gestation of pregnancy: Secondary | ICD-10-CM | POA: Diagnosis not present

## 2016-11-20 DIAGNOSIS — Z361 Encounter for antenatal screening for raised alphafetoprotein level: Secondary | ICD-10-CM | POA: Diagnosis not present

## 2016-11-20 DIAGNOSIS — Z3482 Encounter for supervision of other normal pregnancy, second trimester: Secondary | ICD-10-CM | POA: Diagnosis not present

## 2016-12-18 DIAGNOSIS — Z363 Encounter for antenatal screening for malformations: Secondary | ICD-10-CM | POA: Diagnosis not present

## 2016-12-18 DIAGNOSIS — Z3A20 20 weeks gestation of pregnancy: Secondary | ICD-10-CM | POA: Diagnosis not present

## 2017-02-12 DIAGNOSIS — Z3482 Encounter for supervision of other normal pregnancy, second trimester: Secondary | ICD-10-CM | POA: Diagnosis not present

## 2017-02-12 DIAGNOSIS — Z23 Encounter for immunization: Secondary | ICD-10-CM | POA: Diagnosis not present

## 2017-02-12 DIAGNOSIS — Z3A28 28 weeks gestation of pregnancy: Secondary | ICD-10-CM | POA: Diagnosis not present

## 2017-02-18 DIAGNOSIS — R7309 Other abnormal glucose: Secondary | ICD-10-CM | POA: Diagnosis not present

## 2017-02-21 DIAGNOSIS — O24410 Gestational diabetes mellitus in pregnancy, diet controlled: Secondary | ICD-10-CM | POA: Diagnosis not present

## 2017-03-27 DIAGNOSIS — O3663X0 Maternal care for excessive fetal growth, third trimester, not applicable or unspecified: Secondary | ICD-10-CM | POA: Diagnosis not present

## 2017-03-27 DIAGNOSIS — Z3A34 34 weeks gestation of pregnancy: Secondary | ICD-10-CM | POA: Diagnosis not present

## 2017-03-27 DIAGNOSIS — O24410 Gestational diabetes mellitus in pregnancy, diet controlled: Secondary | ICD-10-CM | POA: Diagnosis not present

## 2017-04-10 DIAGNOSIS — Z3483 Encounter for supervision of other normal pregnancy, third trimester: Secondary | ICD-10-CM | POA: Diagnosis not present

## 2017-04-23 DIAGNOSIS — O34211 Maternal care for low transverse scar from previous cesarean delivery: Secondary | ICD-10-CM | POA: Diagnosis not present

## 2017-04-23 DIAGNOSIS — O24410 Gestational diabetes mellitus in pregnancy, diet controlled: Secondary | ICD-10-CM | POA: Diagnosis not present

## 2017-04-23 DIAGNOSIS — Z364 Encounter for antenatal screening for fetal growth retardation: Secondary | ICD-10-CM | POA: Diagnosis not present

## 2017-04-23 DIAGNOSIS — Z3A38 38 weeks gestation of pregnancy: Secondary | ICD-10-CM | POA: Diagnosis not present

## 2017-04-30 DIAGNOSIS — Z886 Allergy status to analgesic agent status: Secondary | ICD-10-CM | POA: Diagnosis not present

## 2017-04-30 DIAGNOSIS — J45909 Unspecified asthma, uncomplicated: Secondary | ICD-10-CM | POA: Diagnosis not present

## 2017-04-30 DIAGNOSIS — O2492 Unspecified diabetes mellitus in childbirth: Secondary | ICD-10-CM | POA: Diagnosis not present

## 2017-04-30 DIAGNOSIS — Z412 Encounter for routine and ritual male circumcision: Secondary | ICD-10-CM | POA: Diagnosis not present

## 2017-04-30 DIAGNOSIS — Z23 Encounter for immunization: Secondary | ICD-10-CM | POA: Diagnosis not present

## 2017-04-30 DIAGNOSIS — Z3A39 39 weeks gestation of pregnancy: Secondary | ICD-10-CM | POA: Diagnosis not present

## 2017-05-01 DIAGNOSIS — Z3A39 39 weeks gestation of pregnancy: Secondary | ICD-10-CM | POA: Diagnosis not present

## 2017-05-02 DIAGNOSIS — Z412 Encounter for routine and ritual male circumcision: Secondary | ICD-10-CM | POA: Diagnosis not present

## 2017-06-13 DIAGNOSIS — Z1332 Encounter for screening for maternal depression: Secondary | ICD-10-CM | POA: Diagnosis not present

## 2017-06-20 ENCOUNTER — Ambulatory Visit (INDEPENDENT_AMBULATORY_CARE_PROVIDER_SITE_OTHER): Payer: BLUE CROSS/BLUE SHIELD | Admitting: Family Medicine

## 2017-06-20 ENCOUNTER — Encounter: Payer: Self-pay | Admitting: Family Medicine

## 2017-06-20 VITALS — BP 121/82 | HR 88 | Temp 98.1°F | Ht 66.0 in | Wt 156.0 lb

## 2017-06-20 DIAGNOSIS — J0100 Acute maxillary sinusitis, unspecified: Secondary | ICD-10-CM

## 2017-06-20 DIAGNOSIS — B360 Pityriasis versicolor: Secondary | ICD-10-CM

## 2017-06-20 MED ORDER — KETOCONAZOLE 2 % EX CREA: 1.0000 "application " | TOPICAL_CREAM | Freq: Two times a day (BID) | CUTANEOUS | 1 refills | Status: DC

## 2017-06-20 MED ORDER — AMOXICILLIN 875 MG PO TABS: 875.0000 mg | ORAL_TABLET | Freq: Two times a day (BID) | ORAL | 0 refills | Status: DC

## 2017-06-20 NOTE — Progress Notes (Signed)
   Subjective:    Patient ID: Breanna Mcmillan, female    DOB: April 12, 1985, 32 y.o.   MRN: [id number]  HPI 4 days of nasal congestion with green nasal d/c.  +  Cough, dry.  Started with ST.  Has a 14 wk old and is breast feeding.  Both ears are hurting.  Not taking any OTC meds.  She just was not sure what to take since she is breast-feeding.  The sore throat is a little bit better but she is having a lot of pressure in her ears and a lot of pressure on the right side of her face over the maxillary sinus area.  She was having problems with her allergies before this started and says she thinks it has developed into a sinus infection she is not currently taking any allergy medicine.  Is also getting recurrence of the splotchy rash between her breast area and underneath.  She is been treated for before with the ketoconazole cream and would like a refill on that.  Review of Systems     Objective:   Physical Exam  Constitutional: She is oriented to person, place, and time. She appears well-developed and well-nourished.  HENT:  Head: Normocephalic and atraumatic.  Right Ear: External ear normal.  Left Ear: External ear normal.  Nose: Nose normal.  Mouth/Throat: Oropharynx is clear and moist.  TMs and canals are clear.   Eyes: Pupils are equal, round, and reactive to light. Conjunctivae and EOM are normal.  Neck: Neck supple. No thyromegaly present.  Cardiovascular: Normal rate, regular rhythm and normal heart sounds.  Pulmonary/Chest: Effort normal and breath sounds normal. She has no wheezes.  Lymphadenopathy:    She has no cervical adenopathy.  Neurological: She is alert and oriented to person, place, and time.  Skin: Skin is warm and dry.  Slightly pink splotchy rash in between the breasts, along the mid chest.  Psychiatric: She has a normal mood and affect.         Assessment & Plan:  Acute sinusitis -we will treat with amoxicillin since she is breast-feeding.  If this does not  completely clear up then she can call us back and let us know.  She is getting ready to leave town this weekend for a trip.  Tinea versicolor -we will refill her ketoconazole cream.  Is been a couple years since we have sent a prescription.  Getting right on the nipple area since she is breast-feeding.

## 2017-07-01 DIAGNOSIS — Z3202 Encounter for pregnancy test, result negative: Secondary | ICD-10-CM | POA: Diagnosis not present

## 2017-07-01 DIAGNOSIS — Z30430 Encounter for insertion of intrauterine contraceptive device: Secondary | ICD-10-CM | POA: Diagnosis not present

## 2017-08-05 DIAGNOSIS — Z30431 Encounter for routine checking of intrauterine contraceptive device: Secondary | ICD-10-CM | POA: Diagnosis not present

## 2017-08-15 DIAGNOSIS — D1801 Hemangioma of skin and subcutaneous tissue: Secondary | ICD-10-CM | POA: Diagnosis not present

## 2017-08-15 DIAGNOSIS — L821 Other seborrheic keratosis: Secondary | ICD-10-CM | POA: Diagnosis not present

## 2017-08-15 DIAGNOSIS — D225 Melanocytic nevi of trunk: Secondary | ICD-10-CM | POA: Diagnosis not present

## 2017-08-15 DIAGNOSIS — D485 Neoplasm of uncertain behavior of skin: Secondary | ICD-10-CM | POA: Diagnosis not present

## 2017-08-15 DIAGNOSIS — D2271 Melanocytic nevi of right lower limb, including hip: Secondary | ICD-10-CM | POA: Diagnosis not present

## 2017-08-15 DIAGNOSIS — L814 Other melanin hyperpigmentation: Secondary | ICD-10-CM | POA: Diagnosis not present

## 2017-10-01 DIAGNOSIS — Z6825 Body mass index (BMI) 25.0-25.9, adult: Secondary | ICD-10-CM | POA: Diagnosis not present

## 2017-10-01 DIAGNOSIS — Z30432 Encounter for removal of intrauterine contraceptive device: Secondary | ICD-10-CM | POA: Diagnosis not present

## 2017-10-01 DIAGNOSIS — Z3040 Encounter for surveillance of contraceptives, unspecified: Secondary | ICD-10-CM | POA: Diagnosis not present

## 2017-10-01 DIAGNOSIS — Z01419 Encounter for gynecological examination (general) (routine) without abnormal findings: Secondary | ICD-10-CM | POA: Diagnosis not present

## 2017-11-06 ENCOUNTER — Emergency Department (INDEPENDENT_AMBULATORY_CARE_PROVIDER_SITE_OTHER)
Admission: EM | Admit: 2017-11-06 | Discharge: 2017-11-06 | Disposition: A | Discharge status: Home / Self Care | Payer: BLUE CROSS/BLUE SHIELD | Source: Home / Self Care | Attending: Family Medicine | Admitting: Family Medicine

## 2017-11-06 ENCOUNTER — Encounter: Payer: Self-pay | Admitting: Emergency Medicine

## 2017-11-06 DIAGNOSIS — B9789 Other viral agents as the cause of diseases classified elsewhere: Secondary | ICD-10-CM | POA: Diagnosis not present

## 2017-11-06 DIAGNOSIS — H6503 Acute serous otitis media, bilateral: Secondary | ICD-10-CM

## 2017-11-06 DIAGNOSIS — J069 Acute upper respiratory infection, unspecified: Secondary | ICD-10-CM | POA: Diagnosis not present

## 2017-11-06 HISTORY — DX: Unspecified ectopic pregnancy without intrauterine pregnancy: O00.90

## 2017-11-06 MED ORDER — SULFACETAMIDE SODIUM 10 % OP SOLN: 1.0000 [drp] | OPHTHALMIC | 0 refills | Status: DC

## 2017-11-06 MED ORDER — ACETAMINOPHEN 325 MG PO TABS
650.0000 mg | ORAL_TABLET | Freq: Once | ORAL | Status: AC
  Administered 2017-11-06: 650 mg via ORAL

## 2017-11-06 MED ORDER — AMOXICILLIN 875 MG PO TABS: 875.0000 mg | ORAL_TABLET | Freq: Two times a day (BID) | ORAL | 0 refills | Status: DC

## 2017-11-06 NOTE — Discharge Instructions (Addendum)
Take plain guaifenesin (1200mg  extended release tabs such as Mucinex) twice daily, with plenty of water, for cough and congestion.  May continue Pseudoephedrine (30mg , one or two every 4 to 6 hours) for sinus congestion.  Get adequate rest.   Continue using saline nasal spray several times daily and saline nasal irrigation (AYR is a common brand).   Try warm salt water gargles for sore throat.  Stop all antihistamines (Nyquil, etc) for now, and other non-prescription cough/cold preparations. May take Ibuprofen 200mg , 4 tabs every 8 hours with food for fever, chest/sternum discomfort, etc. May take Delsym Cough Suppressant at bedtime for nighttime cough.  For eye irritation, try warm compresses several times daily, and lubricating eye drops (such as "Refresh" eye drops). If eyes become increasingly red and irritated, may begin antibiotic eye drops.

## 2017-11-06 NOTE — ED Triage Notes (Signed)
PT reports cough, congestion, eye pain, ear pain, fever since Friday.  PT is breastfeeding.

## 2017-11-06 NOTE — ED Provider Notes (Signed)
[name]    CSN: [id number] Arrival date & time: 11/06/17  0801     History   Chief Complaint Chief Complaint  Patient presents with  . Cough  . Fever    HPI Breanna Mcmillan is a 32 y.o. female.   Five days ago patient developed typical cold-like symptoms developing over several days, including mild sore throat, sinus congestion, mayalgias, fatigue, and cough.  Two or three days ago she developed pressure in her ears and decreased hearing, while today she developed fever.  She feels tight in her anterior chest without pleuritic pain or shortness of breath.  Two days ago she developed redness in her left eye, which is now also present in her right eye. She has a history of seasonal rhinitis/asthma, and multiple relatives who have asthma.  She states that she never wheezes.  She has had pneumonia in the distant past.  The history is provided by the patient.    Past Medical History:  Diagnosis Date  . Ectopic pregnancy     Patient Active Problem List   Diagnosis Date Noted  . LEG EDEMA 03/14/2010  . COUGH, CHRONIC 08/22/2009  . OTHER ABNORMAL GLUCOSE 09/03/2008  . MIGRAINE W/O AURA W/O INTRACT W/O STAT MIGRNOSUS 08/16/2008  . ASTHMA, INTRINSIC 10/14/2007  . ALLERGIC RHINITIS 08/07/2006    History reviewed. No pertinent surgical history.  OB History    Gravida  1   Para      Term      Preterm      AB      Living        SAB      TAB      Ectopic      Multiple      Live Births               Home Medications    Prior to Admission medications   Medication Sig Start Date End Date Taking? Authorizing Provider  amoxicillin (AMOXIL) 875 MG tablet Take 1 tablet (875 mg total) by mouth 2 (two) times daily. 11/06/17   Kandra Nicolas, MD  ketoconazole (NIZORAL) 2 % cream Apply 1 application topically 2 (two) times daily. X 2-4 weeks 06/20/17   Hali Marry, MD  sulfacetamide (BLEPH-10) 10 % ophthalmic solution Place 1-2 drops  into both eyes every 3 (three) hours while awake. (Rx void after 11/16/17) 11/06/17   Kandra Nicolas, MD    Family History Family History  Problem Relation Age of Onset  . Depression Mother   . Hypertension Father   . Diabetes Paternal Grandfather     Social History Social History   Tobacco Use  . Smoking status: Never Smoker  . Smokeless tobacco: Never Used  Substance Use Topics  . Alcohol use: Yes  . Drug use: Not on file     Allergies   Vicodin [hydrocodone-acetaminophen]   Review of Systems Review of Systems + sore throat + cough No pleuritic pain No wheezing + nasal congestion + post-nasal drainage No sinus pain/pressure + itchy/red eyes + earache No hemoptysis No SOB + fever, + chills No nausea No vomiting No abdominal pain No diarrhea No urinary symptoms No skin rash + fatigue + myalgias No headache Used OTC meds without relief   Physical Exam Triage Vital Signs ED Triage Vitals  Enc Vitals Group     BP 11/06/17 0818 114/77     Pulse Rate 11/06/17 0818 (!) 127     Resp 11/06/17  0818 16     Temp 11/06/17 0818 100 F (37.8 C)     Temp Source 11/06/17 0818 Oral     SpO2 11/06/17 0818 98 %     Weight 11/06/17 0819 154 lb (69.9 kg)     Height --      Head Circumference --      Peak Flow --      Pain Score 11/06/17 0818 6     Pain Loc --      Pain Edu? --      Excl. in [place]? --    No data found.  Updated Vital Signs BP 114/77   Pulse (!) 127   Temp 100 F (37.8 C) (Oral)   Resp 16   Wt 69.9 kg   SpO2 98%   BMI 24.86 kg/m   Visual Acuity Right Eye Distance:   Left Eye Distance:   Bilateral Distance:    Right Eye Near:   Left Eye Near:    Bilateral Near:     Physical Exam Nursing notes and Vital Signs reviewed. Appearance:  Patient appears stated age, and in no acute distress Eyes:  Pupils are equal, round, and reactive to light and accomodation.  Extraocular movement is intact.  Conjunctivae are minimally injected. No  photophobia.  Mucous crusting of eyelashes noted. Ears:  Canals normal.  Tympanic membranes are mildly erythematous bilaterally with serous effusions present.  Nose:  Markedly congested turbinates.  No sinus tenderness.  Pharynx:  Normal Neck:  Supple.  Enlarged posterior/lateral nodes are palpated bilaterally, tender to palpation on the left.   Lungs:  Clear to auscultation.  Breath sounds are equal.  Moving air well. Heart:  Regular rate and rhythm without murmurs, rubs, or gallops.  Abdomen:  Nontender without masses or hepatosplenomegaly.  Bowel sounds are present.  No CVA or flank tenderness.  Extremities:  No edema.  Skin:  No rash present.    UC Treatments / Results  Labs (all labs ordered are listed, but only abnormal results are displayed) Labs Reviewed - No data to display  EKG None  Radiology No results found.  Procedures Procedures (including critical care time)  Medications Ordered in UC Medications  acetaminophen (TYLENOL) tablet 650 mg (650 mg Oral Given 11/06/17 0826)    Initial Impression / Assessment and Plan / UC Course  I have reviewed the triage vital signs and the nursing notes.  Pertinent labs & imaging results that were available during my care of the patient were reviewed by me and considered in my medical decision making (see chart for details).    Begin amoxicillin 875mg  BID. Patient presently has viral conjunctivitis which should improve.  Rx written for sulfacetamide ophth suspension to hold, and begin if eyes become increasingly irritated. Followup with Family Doctor if not improved in about 10 days.   Final Clinical Impressions(s) / UC Diagnoses   Final diagnoses:  Viral URI with cough  Non-recurrent acute serous otitis media of both ears     Discharge Instructions     Take plain guaifenesin (1200mg  extended release tabs such as Mucinex) twice daily, with plenty of water, for cough and congestion.  May continue Pseudoephedrine (30mg ,  one or two every 4 to 6 hours) for sinus congestion.  Get adequate rest.   Continue using saline nasal spray several times daily and saline nasal irrigation (AYR is a common brand).   Try warm salt water gargles for sore throat.  Stop all antihistamines (Nyquil, etc) for now, and  other non-prescription cough/cold preparations. May take Ibuprofen 200mg , 4 tabs every 8 hours with food for fever, chest/sternum discomfort, etc. May take Delsym Cough Suppressant at bedtime for nighttime cough.  For eye irritation, try warm compresses several times daily, and lubricating eye drops (such as "Refresh" eye drops). If eyes become increasingly red and irritated, may begin antibiotic eye drops.    ED Prescriptions    Medication Sig Dispense Auth. Provider   amoxicillin (AMOXIL) 875 MG tablet Take 1 tablet (875 mg total) by mouth 2 (two) times daily. 20 tablet Kandra Nicolas, MD   sulfacetamide (BLEPH-10) 10 % ophthalmic solution Place 1-2 drops into both eyes every 3 (three) hours while awake. (Rx void after 11/16/17) 5 mL Kandra Nicolas, MD         Kandra Nicolas, MD 11/06/17 [phone]

## 2018-02-13 ENCOUNTER — Ambulatory Visit: Payer: BLUE CROSS/BLUE SHIELD | Admitting: Osteopathic Medicine

## 2018-02-13 ENCOUNTER — Encounter: Payer: Self-pay | Admitting: Osteopathic Medicine

## 2018-02-13 VITALS — BP 114/81 | HR 95 | Temp 97.9°F | Wt 156.4 lb

## 2018-02-13 DIAGNOSIS — J0100 Acute maxillary sinusitis, unspecified: Secondary | ICD-10-CM

## 2018-02-13 MED ORDER — AMOXICILLIN-POT CLAVULANATE 875-125 MG PO TABS: 1.0000 | ORAL_TABLET | Freq: Two times a day (BID) | ORAL | 0 refills | Status: DC

## 2018-02-13 NOTE — Patient Instructions (Signed)
Medications & Home Remedies for Upper Respiratory Illness   Note: the following list assumes no pregnancy, normal liver & kidney function and no other drug interactions. Dr. Sheppard Coil has highlighted medications which are safe for you to use, but these may not be appropriate for everyone. Always ask a pharmacist or qualified medical provider if you have any questions!    Aches/Pains, Fever, Headache OTC Acetaminophen (Tylenol) 500 mg tablets - take max 2 tablets (1000 mg) every 6 hours (4 times per day)  OTC Ibuprofen (Motrin) 200 mg tablets - take max 4 tablets (800 mg) every 6 hours   Sinus Congestion OTC Nasal Saline if desired to rinse OTC Oxymetolazone (Afrin, others) sparing use due to rebound congestion, NEVER use in kids OTC Phenylephrine (Sudafed) 10 mg tablets every 4 hours (or the 12-hour formulation) OTC Diphenhydramine (Benadryl) 25 mg tablets - take max 2 tablets every 4 hours   Cough & Sore Throat OTC Dextromethorphan (Robitussin, others) - cough suppressant OTC Guaifenesin (Robitussin, Mucinex, others) - expectorant (helps cough up mucus) (Dextromethorphan and Guaifenesin also come in a combination tablet/syrup) OTC Lozenges w/ Benzocaine + Menthol (Cepacol) Honey - as much as you want! Teas which "coat the throat" - look for ingredients Elm Bark, Licorice Root, Marshmallow Root   Other Prescription Antibiotics if these are necessary for bacterial infection - take ALL, even if you're feeling better  OTC Zinc Lozenges within 24 hours of symptoms onset - mixed evidence this shortens the duration of the common cold Don't waste your money on Vitamin C or Echinacea in acute illness - it's already too late!

## 2018-02-13 NOTE — Progress Notes (Signed)
HPI: Breanna Mcmillan is a 33 y.o. female who  has a past medical history of Ectopic pregnancy.  she presents to [facility] today, 02/13/18,  for chief complaint of: Sick - sinus pressure   . Location: L sinuses . Quality: drainage, congestion  . Duration: 7+ days . Modifying factors: ibuprofen helps pain a bit  . Assoc signs/symptoms: ears feel clogged     Past medical, surgical, social and family history reviewed and updated as necessary.   Current medication list and allergy/intolerance information reviewed:    Current Outpatient Medications  Medication Sig Dispense Refill  . amoxicillin (AMOXIL) 875 MG tablet Take 1 tablet (875 mg total) by mouth 2 (two) times daily. 20 tablet 0  . amoxicillin-clavulanate (AUGMENTIN) 875-125 MG tablet Take 1 tablet by mouth 2 (two) times daily. 14 tablet 0  . ketoconazole (NIZORAL) 2 % cream Apply 1 application topically 2 (two) times daily. X 2-4 weeks 45 g 1  . sulfacetamide (BLEPH-10) 10 % ophthalmic solution Place 1-2 drops into both eyes every 3 (three) hours while awake. (Rx void after 11/16/17) 5 mL 0   No current facility-administered medications for this visit.     Allergies  Allergen Reactions  . Hydrocodone-Acetaminophen Itching and Nausea And Vomiting    Other; causes migraines        Review of Systems:  Constitutional:  No  fever, no chills, +recent illness, No unintentional weight changes. No significant fatigue.   HEENT: No  headache, no vision change, no hearing change, No sore throat, +sinus pressure  Cardiac: No  chest pain, No  pressure, No palpitations  Respiratory:  No  shortness of breath. No  Cough  Gastrointestinal: No  abdominal pain, No  nausea, No  vomiting,  No  blood in stool, No  diarrhea  Musculoskeletal: No new myalgia/arthralgia  Skin: No  Rash  Neurologic: No  weakness, No  dizziness  Exam:  BP 114/81 (BP Location: Left Arm, Patient Position: Sitting,  Cuff Size: Normal)   Pulse 95   Temp 97.9 F (36.6 C) (Oral)   Wt 156 lb 6.4 oz (70.9 kg)   SpO2 100%   BMI 25.24 kg/m   Constitutional: VS see above. General Appearance: alert, well-developed, well-nourished, NAD  Eyes: Normal lids and conjunctive, non-icteric sclera  Ears, Nose, Mouth, Throat: MMM, Normal external inspection ears/nares/mouth/lips/gums. TM normal bilaterally. Pharynx/tonsils no erythema, no exudate. Nasal mucosa normal.   Neck: No masses, trachea midline. No tenderness/mass appreciated. No lymphadenopathy  Respiratory: Normal respiratory effort. no wheeze, no rhonchi, no rales  Cardiovascular: S1/S2 normal, no murmur, no rub/gallop auscultated. RRR. No lower extremity edema.   Gastrointestinal: Nontender, no masses. Bowel sounds normal.  Musculoskeletal: Gait normal.   Neurological: Normal balance/coordination. No tremor.   Skin: warm, dry, intact.   Psychiatric: Normal judgment/insight. Normal mood and affect.     ASSESSMENT/PLAN: The encounter diagnosis was Acute non-recurrent maxillary sinusitis.   Meds ordered this encounter  Medications  . amoxicillin-clavulanate (AUGMENTIN) 875-125 MG tablet    Sig: Take 1 tablet by mouth 2 (two) times daily.    Dispense:  14 tablet    Refill:  0      Patient Instructions  Medications & Home Remedies for Upper Respiratory Illness   Note: the following list assumes no pregnancy, normal liver & kidney function and no other drug interactions. Dr. Sheppard Coil has highlighted medications which are safe for you to use, but these may not be appropriate for everyone. Always  ask a pharmacist or qualified medical provider if you have any questions!    Aches/Pains, Fever, Headache OTC Acetaminophen (Tylenol) 500 mg tablets - take max 2 tablets (1000 mg) every 6 hours (4 times per day)  OTC Ibuprofen (Motrin) 200 mg tablets - take max 4 tablets (800 mg) every 6 hours   Sinus Congestion OTC Nasal Saline if desired to  rinse OTC Oxymetolazone (Afrin, others) sparing use due to rebound congestion, NEVER use in kids OTC Phenylephrine (Sudafed) 10 mg tablets every 4 hours (or the 12-hour formulation) OTC Diphenhydramine (Benadryl) 25 mg tablets - take max 2 tablets every 4 hours   Cough & Sore Throat OTC Dextromethorphan (Robitussin, others) - cough suppressant OTC Guaifenesin (Robitussin, Mucinex, others) - expectorant (helps cough up mucus) (Dextromethorphan and Guaifenesin also come in a combination tablet/syrup) OTC Lozenges w/ Benzocaine + Menthol (Cepacol) Honey - as much as you want! Teas which "coat the throat" - look for ingredients Elm Bark, Licorice Root, Marshmallow Root   Other Prescription Antibiotics if these are necessary for bacterial infection - take ALL, even if you're feeling better  OTC Zinc Lozenges within 24 hours of symptoms onset - mixed evidence this shortens the duration of the common cold Don't waste your money on Vitamin C or Echinacea in acute illness - it's already too late!          Visit summary with medication list and pertinent instructions was printed for patient to review. All questions at time of visit were answered - patient instructed to contact office with any additional concerns or updates. ER/RTC precautions were reviewed with the patient.   Follow-up plan: Return if symptoms worsen or fail to improve.    Please note: voice recognition software was used to produce this document, and typos may escape review. Please contact Dr. Sheppard Coil for any needed clarifications.

## 2018-03-15 ENCOUNTER — Emergency Department
Admission: EM | Admit: 2018-03-15 | Discharge: 2018-03-15 | Disposition: A | Discharge status: Home / Self Care | Payer: BLUE CROSS/BLUE SHIELD | Source: Home / Self Care

## 2018-03-15 ENCOUNTER — Encounter: Payer: Self-pay | Admitting: Emergency Medicine

## 2018-03-15 DIAGNOSIS — Z8709 Personal history of other diseases of the respiratory system: Secondary | ICD-10-CM

## 2018-03-15 DIAGNOSIS — J0100 Acute maxillary sinusitis, unspecified: Secondary | ICD-10-CM

## 2018-03-15 DIAGNOSIS — J069 Acute upper respiratory infection, unspecified: Secondary | ICD-10-CM

## 2018-03-15 MED ORDER — AZITHROMYCIN 250 MG PO TABS: 250.0000 mg | ORAL_TABLET | Freq: Every day | ORAL | 0 refills | Status: DC

## 2018-03-15 MED ORDER — ALBUTEROL SULFATE HFA 108 (90 BASE) MCG/ACT IN AERS: 1.0000 | INHALATION_SPRAY | Freq: Four times a day (QID) | RESPIRATORY_TRACT | 0 refills | Status: DC | PRN

## 2018-03-15 MED ORDER — IPRATROPIUM BROMIDE 0.06 % NA SOLN: 2.0000 | Freq: Four times a day (QID) | NASAL | 1 refills | Status: DC

## 2018-03-15 NOTE — ED Triage Notes (Signed)
Patient c/o possible sinus infection since Thursday, head congestion, ears feel swimmy, chest tightness.

## 2018-03-15 NOTE — ED Provider Notes (Signed)
[name]    CSN: [id number] Arrival date & time: 03/15/18  1545     History   Chief Complaint Chief Complaint  Patient presents with  . Facial Pain    HPI Breanna Mcmillan is a 33 y.o. female.   HPI Breanna Mcmillan is a 33 y.o. female presenting to UC with c/o 3 days of worsening sinus congestion with sinus pain and pressure, worse on the Right side of her face. Associated ear fullness and "whooshing sound" when she turns her head.  Mild chest tightness but she denies chest pain or SOB.  She is currently breastfeeding her 7mo old daughter who has also had congestion as well as her husband. Denies chills, n/v/d.    Past Medical History:  Diagnosis Date  . Ectopic pregnancy     Patient Active Problem List   Diagnosis Date Noted  . LEG EDEMA 03/14/2010  . COUGH, CHRONIC 08/22/2009  . OTHER ABNORMAL GLUCOSE 09/03/2008  . MIGRAINE W/O AURA W/O INTRACT W/O STAT MIGRNOSUS 08/16/2008  . ASTHMA, INTRINSIC 10/14/2007  . ALLERGIC RHINITIS 08/07/2006    History reviewed. No pertinent surgical history.  OB History    Gravida  1   Para      Term      Preterm      AB      Living        SAB      TAB      Ectopic      Multiple      Live Births               Home Medications    Prior to Admission medications   Medication Sig Start Date End Date Taking? Authorizing Provider  albuterol (PROVENTIL HFA;VENTOLIN HFA) 108 (90 Base) MCG/ACT inhaler Inhale 1-2 puffs into the lungs every 6 (six) hours as needed for wheezing or shortness of breath. 03/15/18   Noe Gens, PA-C  amoxicillin-clavulanate (AUGMENTIN) 875-125 MG tablet Take 1 tablet by mouth 2 (two) times daily. 02/13/18   Emeterio Reeve, DO  azithromycin (ZITHROMAX) 250 MG tablet Take 1 tablet (250 mg total) by mouth daily. Take first 2 tablets together, then 1 every day until finished. 03/15/18   Noe Gens, PA-C  ipratropium (ATROVENT) 0.06 % nasal spray Place 2 sprays into both  nostrils 4 (four) times daily. 03/15/18   Noe Gens, PA-C  ketoconazole (NIZORAL) 2 % cream Apply 1 application topically 2 (two) times daily. X 2-4 weeks 06/20/17   Hali Marry, MD  NORLYDA 0.35 MG tablet TK 1 T PO QD 12/03/17   [provider]  sulfacetamide (BLEPH-10) 10 % ophthalmic solution Place 1-2 drops into both eyes every 3 (three) hours while awake. (Rx void after 11/16/17) Patient not taking: Reported on 02/13/2018 11/06/17   Kandra Nicolas, MD    Family History Family History  Problem Relation Age of Onset  . Depression Mother   . Hypertension Father   . Diabetes Paternal Grandfather     Social History Social History   Tobacco Use  . Smoking status: Never Smoker  . Smokeless tobacco: Never Used  Substance Use Topics  . Alcohol use: Yes  . Drug use: Not on file     Allergies   Hydrocodone-acetaminophen   Review of Systems Review of Systems  Constitutional: Positive for fever (low-grade). Negative for chills.  HENT: Positive for congestion, ear pain, sinus pressure, sinus pain and sore throat. Negative for trouble  swallowing and voice change.   Respiratory: Positive for cough and chest tightness. Negative for shortness of breath.   Cardiovascular: Negative for chest pain and palpitations.  Gastrointestinal: Negative for abdominal pain, diarrhea, nausea and vomiting.  Musculoskeletal: Negative for arthralgias, back pain and myalgias.  Skin: Negative for rash.  Neurological: Positive for headaches.     Physical Exam Triage Vital Signs ED Triage Vitals  Enc Vitals Group     BP 03/15/18 1609 128/89     Pulse Rate 03/15/18 1609 (!) 118     Resp --      Temp 03/15/18 1609 99.3 F (37.4 C)     Temp Source 03/15/18 1609 Oral     SpO2 03/15/18 1609 99 %     Weight 03/15/18 1610 157 lb 12 oz (71.6 kg)     Height 03/15/18 1610 5\' 6"  (1.676 m)     Head Circumference --      Peak Flow --      Pain Score 03/15/18 1610 0     Pain Loc --       Pain Edu? --      Excl. in [place]? --    No data found.  Updated Vital Signs BP 128/89 (BP Location: Right Arm)   Pulse (!) 118   Temp 99.3 F (37.4 C) (Oral)   Ht 5\' 6"  (1.676 m)   Wt 157 lb 12 oz (71.6 kg)   SpO2 99%   BMI 25.46 kg/m   Visual Acuity Right Eye Distance:   Left Eye Distance:   Bilateral Distance:    Right Eye Near:   Left Eye Near:    Bilateral Near:     Physical Exam Vitals signs and nursing note reviewed.  Constitutional:      Appearance: Normal appearance. She is well-developed.  HENT:     Head: Normocephalic and atraumatic.     Right Ear: Tympanic membrane normal.     Left Ear: Tympanic membrane normal.     Nose: Congestion present.     Right Sinus: Maxillary sinus tenderness present. No frontal sinus tenderness.     Left Sinus: Maxillary sinus tenderness present. No frontal sinus tenderness.     Mouth/Throat:     Lips: Pink.     Mouth: Mucous membranes are moist.     Pharynx: Oropharynx is clear. Uvula midline.  Neck:     Musculoskeletal: Normal range of motion and neck supple.  Cardiovascular:     Rate and Rhythm: Regular rhythm. Tachycardia present.  Pulmonary:     Effort: Pulmonary effort is normal. No respiratory distress.     Breath sounds: Normal breath sounds. No stridor. No wheezing or rhonchi.  Musculoskeletal: Normal range of motion.  Skin:    General: Skin is warm and dry.  Neurological:     Mental Status: She is alert and oriented to person, place, and time.  Psychiatric:        Behavior: Behavior normal.      UC Treatments / Results  Labs (all labs ordered are listed, but only abnormal results are displayed) Labs Reviewed - No data to display  EKG None  Radiology No results found.  Procedures Procedures (including critical care time)  Medications Ordered in UC Medications - No data to display  Initial Impression / Assessment and Plan / UC Course  I have reviewed the triage vital signs and the nursing  notes.  Pertinent labs & imaging results that were available during my care of the patient  were reviewed by me and considered in my medical decision making (see chart for details).     Hx and exam c/w sinusitis secondary to URI Pt requesting an inhaler as it has helped in the past even when she has not had a wheeze. Pt was on Augmentin last month for similar symptoms, will start her on azithromycin today.  Final Clinical Impressions(s) / UC Diagnoses   Final diagnoses:  Acute non-recurrent maxillary sinusitis  Acute upper respiratory infection  History of asthma     Discharge Instructions      Please take antibiotics as prescribed and be sure to complete entire course even if you start to feel better to ensure infection does not come back.  You may take 500mg  acetaminophen every 4-6 hours or in combination with ibuprofen 400-600mg  every 6-8 hours as needed for pain, inflammation, and fever.  Be sure to well hydrated with clear liquids and get at least 8 hours of sleep at night, preferably more while sick.   Please follow up with family medicine in 1 week if needed.     ED Prescriptions    Medication Sig Dispense Auth. Provider   azithromycin (ZITHROMAX) 250 MG tablet Take 1 tablet (250 mg total) by mouth daily. Take first 2 tablets together, then 1 every day until finished. 6 tablet Leeroy Cha O, PA-C   ipratropium (ATROVENT) 0.06 % nasal spray Place 2 sprays into both nostrils 4 (four) times daily. 15 mL Baruc Tugwell O, PA-C   albuterol (PROVENTIL HFA;VENTOLIN HFA) 108 (90 Base) MCG/ACT inhaler Inhale 1-2 puffs into the lungs every 6 (six) hours as needed for wheezing or shortness of breath. 1 Inhaler Noe Gens, PA-C     Controlled Substance Prescriptions [place] Controlled Substance Registry consulted? Not Applicable   Tyrell Antonio 03/15/18 1628

## 2018-03-15 NOTE — Discharge Instructions (Signed)

## 2018-08-04 ENCOUNTER — Telehealth: Payer: Self-pay | Admitting: *Deleted

## 2018-08-04 NOTE — Telephone Encounter (Signed)
Pt called in today wanting to know if you'd restart the ocp that she was taking a few years ago before trying to conceive.  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  She has finished nursing and would like to go back on what you had her taking previously.  I told her that you probably wouldn't care but I wanted to make sure she didn't need an appointment first.  Please advise.

## 2018-08-05 MED ORDER — NECON 1/50 (28) 1-50 MG-MCG PO TABS: 1.0000 | ORAL_TABLET | Freq: Every day | ORAL | 3 refills | Status: DC

## 2018-08-05 NOTE — Telephone Encounter (Signed)
Pt notified of rx. 

## 2018-08-05 NOTE — Telephone Encounter (Signed)
OK, generic for Necon sent to pharmacy.  I believe that was the one she was taking.

## 2018-08-13 DIAGNOSIS — Z20828 Contact with and (suspected) exposure to other viral communicable diseases: Secondary | ICD-10-CM | POA: Diagnosis not present

## 2018-08-19 DIAGNOSIS — L821 Other seborrheic keratosis: Secondary | ICD-10-CM | POA: Diagnosis not present

## 2018-08-19 DIAGNOSIS — L579 Skin changes due to chronic exposure to nonionizing radiation, unspecified: Secondary | ICD-10-CM | POA: Diagnosis not present

## 2018-08-19 DIAGNOSIS — D225 Melanocytic nevi of trunk: Secondary | ICD-10-CM | POA: Diagnosis not present

## 2018-08-19 DIAGNOSIS — L814 Other melanin hyperpigmentation: Secondary | ICD-10-CM | POA: Diagnosis not present

## 2018-09-02 ENCOUNTER — Other Ambulatory Visit: Payer: Self-pay

## 2018-09-02 ENCOUNTER — Emergency Department
Admission: EM | Admit: 2018-09-02 | Discharge: 2018-09-02 | Disposition: A | Discharge status: Home / Self Care | Payer: BLUE CROSS/BLUE SHIELD | Source: Home / Self Care

## 2018-09-02 DIAGNOSIS — L298 Other pruritus: Secondary | ICD-10-CM | POA: Diagnosis not present

## 2018-09-02 DIAGNOSIS — W57XXXA Bitten or stung by nonvenomous insect and other nonvenomous arthropods, initial encounter: Secondary | ICD-10-CM

## 2018-09-02 MED ORDER — DEXAMETHASONE SODIUM PHOSPHATE 10 MG/ML IJ SOLN
10.0000 mg | Freq: Once | INTRAMUSCULAR | Status: AC
  Administered 2018-09-02: 10 mg via INTRAMUSCULAR

## 2018-09-02 MED ORDER — TRIAMCINOLONE ACETONIDE 0.1 % EX CREA: 1.0000 "application " | TOPICAL_CREAM | Freq: Two times a day (BID) | CUTANEOUS | 0 refills | Status: DC

## 2018-09-02 MED ORDER — PREDNISONE 50 MG PO TABS: 50.0000 mg | ORAL_TABLET | Freq: Every day | ORAL | 0 refills | Status: AC

## 2018-09-02 NOTE — ED Triage Notes (Signed)
Noticed several bites on Sunday, a few more yesterday.  Took 2 benadryl last night and woke up with more bites.

## 2018-09-02 NOTE — ED Provider Notes (Signed)
[name]    CSN: [id number] Arrival date & time: 09/02/18  0901      History   Chief Complaint Chief Complaint  Patient presents with  . Rash    HPI Breanna Mcmillan is a 33 y.o. female.   HPI Breanna Mcmillan is a 33 y.o. female presenting to UC with c/o gradually worsening itchy red bump rash that started 2 days ago after going for a hike 3 days ago. Pt states she did sit on a boulder and wonders if that is how she got bit. She notes no one else in the group has a rash. She took Benadryl last night and was able to sleep but each day she has noticed more bumps. She also takes an OTC daily allergy pill.  Denies pain, drainage or bleeding from the bumps. Denies fever, chills, oral swelling, or other symptoms. No new soaps. Lotions or medications.   Past Medical History:  Diagnosis Date  . Ectopic pregnancy     Patient Active Problem List   Diagnosis Date Noted  . LEG EDEMA 03/14/2010  . COUGH, CHRONIC 08/22/2009  . OTHER ABNORMAL GLUCOSE 09/03/2008  . MIGRAINE W/O AURA W/O INTRACT W/O STAT MIGRNOSUS 08/16/2008  . ASTHMA, INTRINSIC 10/14/2007  . ALLERGIC RHINITIS 08/07/2006    History reviewed. No pertinent surgical history.  OB History    Gravida  1   Para      Term      Preterm      AB      Living        SAB      TAB      Ectopic      Multiple      Live Births               Home Medications    Prior to Admission medications   Medication Sig Start Date End Date Taking? Authorizing Provider  albuterol (PROVENTIL HFA;VENTOLIN HFA) 108 (90 Base) MCG/ACT inhaler Inhale 1-2 puffs into the lungs every 6 (six) hours as needed for wheezing or shortness of breath. 03/15/18   Noe Gens, PA-C  ipratropium (ATROVENT) 0.06 % nasal spray Place 2 sprays into both nostrils 4 (four) times daily. 03/15/18   Noe Gens, PA-C  ketoconazole (NIZORAL) 2 % cream Apply 1 application topically 2 (two) times daily. X 2-4 weeks 06/20/17   Hali Marry, MD  Norethindrone-Mestranol (NECON 1/50, 28,) 1-50 MG-MCG tablet Take 1 tablet by mouth daily. 08/05/18   Hali Marry, MD  predniSONE (DELTASONE) 50 MG tablet Take 1 tablet (50 mg total) by mouth daily with breakfast for 5 days. 09/02/18 09/07/18  Noe Gens, PA-C  triamcinolone cream (KENALOG) 0.1 % Apply 1 application topically 2 (two) times daily. 09/02/18   Noe Gens, PA-C    Family History Family History  Problem Relation Age of Onset  . Depression Mother   . Hypertension Father   . Diabetes Paternal Grandfather     Social History Social History   Tobacco Use  . Smoking status: Never Smoker  . Smokeless tobacco: Never Used  Substance Use Topics  . Alcohol use: Yes  . Drug use: Not on file     Allergies   Hydrocodone-acetaminophen   Review of Systems Review of Systems  Constitutional: Negative for chills and fever.  HENT: Negative for sore throat.   Respiratory: Negative for shortness of breath and wheezing.   Musculoskeletal: Negative for arthralgias, joint swelling  and myalgias.  Skin: Positive for rash. Negative for wound.     Physical Exam Triage Vital Signs ED Triage Vitals  Enc Vitals Group     BP 09/02/18 0914 114/82     Pulse Rate 09/02/18 0914 97     Resp 09/02/18 0914 20     Temp 09/02/18 0914 98 F (36.7 C)     Temp src --      SpO2 09/02/18 0914 99 %     Weight 09/02/18 0915 167 lb (75.8 kg)     Height 09/02/18 0915 5\' 6"  (1.676 m)     Head Circumference --      Peak Flow --      Pain Score 09/02/18 0915 0     Pain Loc --      Pain Edu? --      Excl. in [place]? --    No data found.  Updated Vital Signs BP 114/82 (BP Location: Right Arm)   Pulse 97   Temp 98 F (36.7 C)   Resp 20   Ht 5\' 6"  (1.676 m)   Wt 167 lb (75.8 kg)   LMP 08/11/2018   SpO2 99%   BMI 26.95 kg/m   Visual Acuity Right Eye Distance:   Left Eye Distance:   Bilateral Distance:    Right Eye Near:   Left Eye Near:    Bilateral Near:      Physical Exam Vitals signs and nursing note reviewed.  Constitutional:      Appearance: Normal appearance. She is well-developed.  HENT:     Head: Normocephalic and atraumatic.  Neck:     Musculoskeletal: Normal range of motion.  Cardiovascular:     Rate and Rhythm: Normal rate.  Pulmonary:     Effort: Pulmonary effort is normal.  Musculoskeletal: Normal range of motion.  Skin:    General: Skin is warm and dry.     Findings: Erythema and rash present. Rash is vesicular.          Comments: Diffuse erythematous vesicular rash, primarily on feet and Right posterior thigh. Non-tender. No bleeding or drainage.   Neurological:     Mental Status: She is alert and oriented to person, place, and time.  Psychiatric:        Behavior: Behavior normal.      UC Treatments / Results  Labs (all labs ordered are listed, but only abnormal results are displayed) Labs Reviewed - No data to display  EKG   Radiology No results found.  Procedures Procedures (including critical care time)  Medications Ordered in UC Medications  dexamethasone (DECADRON) injection 10 mg (10 mg Intramuscular Given 09/02/18 0940)    Initial Impression / Assessment and Plan / UC Course  I have reviewed the triage vital signs and the nursing notes.  Pertinent labs & imaging results that were available during my care of the patient were reviewed by me and considered in my medical decision making (see chart for details).     Rash c/w insect bites w/o secondary infection No lesions in web spacing and no track lines. Rash most c/w an insect such as chiggers rather than scabies. Will tx with steroids. AVS provided.  Final Clinical Impressions(s) / UC Diagnoses   Final diagnoses:  Pruritic erythematous rash  Multiple insect bites     Discharge Instructions      Keep rash clean with warm water and mild soap. Do not use hot water as this can cause worsening itching later on.  Pat dry, do not rub  the rash.   You were given a shot of decadron (a steroid) today to help with itching and swelling from a likely allergic reaction to the insect bites.  You have been prescribed 5 days of prednisone, an oral steroid.  You may start this medication tomorrow with breakfast.    Please follow up with family medicine in 1 week if not improving, sooner if significantly worsening.     ED Prescriptions    Medication Sig Dispense Auth. Provider   predniSONE (DELTASONE) 50 MG tablet Take 1 tablet (50 mg total) by mouth daily with breakfast for 5 days. 5 tablet Leeroy Cha O, PA-C   triamcinolone cream (KENALOG) 0.1 % Apply 1 application topically 2 (two) times daily. 30 g Noe Gens, PA-C     Controlled Substance Prescriptions [place] Controlled Substance Registry consulted? Not Applicable   Tyrell Antonio 09/02/18 [id number]

## 2018-09-02 NOTE — Discharge Instructions (Signed)
°  Keep rash clean with warm water and mild soap. Do not use hot water as this can cause worsening itching later on.  Pat dry, do not rub the rash.   You were given a shot of decadron (a steroid) today to help with itching and swelling from a likely allergic reaction to the insect bites.  You have been prescribed 5 days of prednisone, an oral steroid.  You may start this medication tomorrow with breakfast.    Please follow up with family medicine in 1 week if not improving, sooner if significantly worsening.

## 2018-09-25 DIAGNOSIS — D485 Neoplasm of uncertain behavior of skin: Secondary | ICD-10-CM | POA: Diagnosis not present

## 2018-09-25 DIAGNOSIS — L814 Other melanin hyperpigmentation: Secondary | ICD-10-CM | POA: Diagnosis not present

## 2018-09-25 DIAGNOSIS — D2310 Other benign neoplasm of skin of unspecified eyelid, including canthus: Secondary | ICD-10-CM | POA: Diagnosis not present

## 2018-11-18 ENCOUNTER — Other Ambulatory Visit: Payer: Self-pay | Admitting: Ophthalmology

## 2018-11-18 DIAGNOSIS — D487 Neoplasm of uncertain behavior of other specified sites: Secondary | ICD-10-CM | POA: Diagnosis not present

## 2018-11-18 DIAGNOSIS — D22111 Melanocytic nevi of right upper eyelid, including canthus: Secondary | ICD-10-CM | POA: Diagnosis not present

## 2018-11-18 DIAGNOSIS — D23112 Other benign neoplasm of skin of right lower eyelid, including canthus: Secondary | ICD-10-CM | POA: Diagnosis not present

## 2018-11-18 DIAGNOSIS — D485 Neoplasm of uncertain behavior of skin: Secondary | ICD-10-CM | POA: Diagnosis not present

## 2018-11-25 ENCOUNTER — Encounter: Payer: Self-pay | Admitting: Family Medicine

## 2018-11-25 ENCOUNTER — Ambulatory Visit (INDEPENDENT_AMBULATORY_CARE_PROVIDER_SITE_OTHER): Payer: BLUE CROSS/BLUE SHIELD | Admitting: Family Medicine

## 2018-11-25 VITALS — Temp 97.9°F

## 2018-11-25 DIAGNOSIS — J4520 Mild intermittent asthma, uncomplicated: Secondary | ICD-10-CM | POA: Diagnosis not present

## 2018-11-25 DIAGNOSIS — J301 Allergic rhinitis due to pollen: Secondary | ICD-10-CM | POA: Diagnosis not present

## 2018-11-25 MED ORDER — ALBUTEROL SULFATE HFA 108 (90 BASE) MCG/ACT IN AERS: 1.0000 | INHALATION_SPRAY | Freq: Four times a day (QID) | RESPIRATORY_TRACT | 6 refills | Status: DC | PRN

## 2018-11-25 MED ORDER — ALBUTEROL SULFATE HFA 108 (90 BASE) MCG/ACT IN AERS: 2.0000 | INHALATION_SPRAY | Freq: Four times a day (QID) | RESPIRATORY_TRACT | 1 refills | Status: DC | PRN

## 2018-11-25 NOTE — Progress Notes (Signed)
She stated that since she was sick in march her sxs have been bothering her since then.Breanna Mcmillan, [place]

## 2018-11-25 NOTE — Progress Notes (Signed)
Virtual Visit via Video Note  I connected with Breanna Mcmillan on 11/25/18 at  3:20 PM EST by a video enabled telemedicine application and verified that I am speaking with the correct person using two identifiers.   I discussed the limitations of evaluation and management by telemedicine and the availability of in person appointments. The patient expressed understanding and agreed to proceed.  Subjective:    CC: Asthma   HPI: 33 year old female is doing a virtual visit today for asthma.  Her albuterol inhaler was refilled back in March in urgent care when she went for respiratory symptoms and sinus infection.  She says since then she is actually lost her inhaler.  But more recently she has been experiencing allergy symptoms and she says that the allergies seem to be aggravating her breathing.  She said she had not had a problem with asthma in years until she actually had a respiratory infection back in March.  She says that when she gets symptoms she will just cough and feel a little short of breath.  She says she has not noticed any wheezing.  In fact she says she is never actually wheezed.  She tried finding her inhaler from March and could not find it.  She has been taking Zyrtec daily for her allergies she is denying any nighttime symptoms.  No other fever chills etc.   Past medical history, Surgical history, Family history not pertinant except as noted below, Social history, Allergies, and medications have been entered into the medical record, reviewed, and corrections made.   Review of Systems: No fevers, chills, night sweats, weight loss, chest pain, or shortness of breath.   Objective:    General: Speaking clearly in complete sentences without any shortness of breath.  Alert and oriented x3.  Normal judgment. No apparent acute distress.    Impression and Recommendations:    Asthma, mild intermittent - will refill her albuterol.  Encouraged her to monitor how often she is using it.   If using 3 or more times a week then may need to be on a controller.    Allergic rhinitis - continue daily zyrtec.      I discussed the assessment and treatment plan with the patient. The patient was provided an opportunity to ask questions and all were answered. The patient agreed with the plan and demonstrated an understanding of the instructions.   The patient was advised to call back or seek an in-person evaluation if the symptoms worsen or if the condition fails to improve as anticipated.   Beatrice Lecher, MD

## 2019-01-14 ENCOUNTER — Encounter: Payer: Self-pay | Admitting: Family Medicine

## 2019-01-14 ENCOUNTER — Other Ambulatory Visit: Payer: Self-pay

## 2019-01-14 ENCOUNTER — Ambulatory Visit (INDEPENDENT_AMBULATORY_CARE_PROVIDER_SITE_OTHER): Payer: BC Managed Care – PPO | Admitting: Family Medicine

## 2019-01-14 ENCOUNTER — Other Ambulatory Visit (HOSPITAL_COMMUNITY)
Admission: RE | Admit: 2019-01-14 | Discharge: 2019-01-14 | Disposition: A | Discharge status: Home / Self Care | Payer: BC Managed Care – PPO | Source: Ambulatory Visit | Attending: Family Medicine | Admitting: Family Medicine

## 2019-01-14 VITALS — BP 119/72 | HR 92 | Ht 66.0 in | Wt 172.0 lb

## 2019-01-14 DIAGNOSIS — Z0000 Encounter for general adult medical examination without abnormal findings: Secondary | ICD-10-CM

## 2019-01-14 DIAGNOSIS — Z23 Encounter for immunization: Secondary | ICD-10-CM

## 2019-01-14 DIAGNOSIS — Z124 Encounter for screening for malignant neoplasm of cervix: Secondary | ICD-10-CM

## 2019-01-14 NOTE — Progress Notes (Signed)
Subjective:     ALIENE TAMURA is a 34 y.o. female and is here for a comprehensive physical exam. The patient reports no problems.  Her sisters also who patient here just had an emergency induction.  They are doing well.  The baby is still deliver 2 pounds.  She says she has gained some weight since she quit breast-feeding and has tried to start getting a little bit more active.  She is also due for her Pap smear would like to get that done today as well.  Mood has been okay.  One of her kids has been doing virtual learning from home and with Covid this year it has been a little bit more stressful.  Social History   Socioeconomic History  . Marital status: Married    Spouse name: micheal  . Number of children: Not on file  . Years of education: Not on file  . Highest education level: Not on file  Occupational History  . Occupation: Glass blower/designer.    Employer: [employer]  Tobacco Use  . Smoking status: Never Smoker  . Smokeless tobacco: Never Used  Substance and Sexual Activity  . Alcohol use: Yes  . Drug use: Not on file  . Sexual activity: Yes    Partners: Male  Other Topics Concern  . Not on file  Social History Narrative   Father with IMGUS (possible multiple myeloma).  Occ exercise.  1 caffeine drink per day.    Social Determinants of Health   Financial Resource Strain:   . Difficulty of Paying Living Expenses: Not on file  Food Insecurity:   . Worried About Charity fundraiser in the Last Year: Not on file  . Ran Out of Food in the Last Year: Not on file  Transportation Needs:   . Lack of Transportation (Medical): Not on file  . Lack of Transportation (Non-Medical): Not on file  Physical Activity:   . Days of Exercise per Week: Not on file  . Minutes of Exercise per Session: Not on file  Stress:   . Feeling of Stress : Not on file  Social Connections:   . Frequency of Communication with Friends and Family: Not on file  . Frequency of Social Gatherings  with Friends and Family: Not on file  . Attends Religious Services: Not on file  . Active Member of Clubs or Organizations: Not on file  . Attends Archivist Meetings: Not on file  . Marital Status: Not on file  Intimate Partner Violence:   . Fear of Current or Ex-Partner: Not on file  . Emotionally Abused: Not on file  . Physically Abused: Not on file  . Sexually Abused: Not on file   Health Maintenance  Topic Date Due  . HIV Screening  09/06/1999  . PAP SMEAR-Modifier  06/02/2020  . TETANUS/TDAP  06/02/2025  . INFLUENZA VACCINE  Completed    The following portions of the patient's history were reviewed and updated as appropriate: allergies, current medications, past family history, past medical history, past social history, past surgical history and problem list.  Review of Systems A comprehensive review of systems was negative.   Objective:    BP 119/72   Pulse 92   Ht '5\' 6"'  (1.676 m)   Wt 172 lb (78 kg)   LMP 12/22/2018 (Exact Date)   SpO2 100%   BMI 27.76 kg/m  General appearance: alert, cooperative and appears stated age Head: Normocephalic, without obvious abnormality, atraumatic Eyes: conj cler,  EOMI, PEERLA Ears: normal TM's and external ear canals both ears Nose: Nares normal. Septum midline. Mucosa normal. No drainage or sinus tenderness. Throat: lips, mucosa, and tongue normal; teeth and gums normal Neck: no adenopathy, no carotid bruit, no JVD, supple, symmetrical, trachea midline and thyroid not enlarged, symmetric, no tenderness/mass/nodules Back: symmetric, no curvature. ROM normal. No CVA tenderness. Lungs: clear to auscultation bilaterally Breasts: normal appearance, no masses or tenderness Heart: regular rate and rhythm, S1, S2 normal, no murmur, click, rub or gallop Abdomen: soft, non-tender; bowel sounds normal; no masses,  no organomegaly Pelvic: cervix normal in appearance, external genitalia normal, no adnexal masses or tenderness, no  cervical motion tenderness, rectovaginal septum normal, uterus normal size, shape, and consistency and vagina normal without discharge Extremities: extremities normal, atraumatic, no cyanosis or edema Pulses: 2+ and symmetric Skin: Skin color, texture, turgor normal. No rashes or lesions Lymph nodes: Cervical, supraclavicular, and axillary nodes normal. Neurologic: Alert and oriented X 3, normal strength and tone. Normal symmetric reflexes. Normal coordination and gait    Assessment:    Healthy female exam.     Plan:     See After Visit Summary for Counseling Recommendations   Keep up a regular exercise program and make sure you are eating a healthy diet Try to eat 4 servings of dairy a day, or if you are lactose intolerant take a calcium with vitamin D daily.  Your vaccines are up to date.

## 2019-01-14 NOTE — Patient Instructions (Addendum)
Can try My Fitness Pal or Lose It to help you track your calorie intake and set some goals for weight loss.        Health Maintenance, Female Adopting a healthy lifestyle and getting preventive care are important in promoting health and wellness. Ask your health care provider about:  The right schedule for you to have regular tests and exams.  Things you can do on your own to prevent diseases and keep yourself healthy. What should I know about diet, weight, and exercise? Eat a healthy diet   Eat a diet that includes plenty of vegetables, fruits, low-fat dairy products, and lean protein.  Do not eat a lot of foods that are high in solid fats, added sugars, or sodium. Maintain a healthy weight Body mass index (BMI) is used to identify weight problems. It estimates body fat based on height and weight. Your health care provider can help determine your BMI and help you achieve or maintain a healthy weight. Get regular exercise Get regular exercise. This is one of the most important things you can do for your health. Most adults should:  Exercise for at least 150 minutes each week. The exercise should increase your heart rate and make you sweat (moderate-intensity exercise).  Do strengthening exercises at least twice a week. This is in addition to the moderate-intensity exercise.  Spend less time sitting. Even light physical activity can be beneficial. Watch cholesterol and blood lipids Have your blood tested for lipids and cholesterol at 35 years of age, then have this test every 5 years. Have your cholesterol levels checked more often if:  Your lipid or cholesterol levels are high.  You are older than 35 years of age.  You are at high risk for heart disease. What should I know about cancer screening? Depending on your health history and family history, you may need to have cancer screening at various ages. This may include screening for:  Breast cancer.  Cervical  cancer.  Colorectal cancer.  Skin cancer.  Lung cancer. What should I know about heart disease, diabetes, and high blood pressure? Blood pressure and heart disease  High blood pressure causes heart disease and increases the risk of stroke. This is more likely to develop in people who have high blood pressure readings, are of African descent, or are overweight.  Have your blood pressure checked: ? Every 3-5 years if you are 5-55 years of age. ? Every year if you are 55 years old or older. Diabetes Have regular diabetes screenings. This checks your fasting blood sugar level. Have the screening done:  Once every three years after age 32 if you are at a normal weight and have a low risk for diabetes.  More often and at a younger age if you are overweight or have a high risk for diabetes. What should I know about preventing infection? Hepatitis B If you have a higher risk for hepatitis B, you should be screened for this virus. Talk with your health care provider to find out if you are at risk for hepatitis B infection. Hepatitis C Testing is recommended for:  Everyone born from [birth year] through 1966.  Anyone with known risk factors for hepatitis C. Sexually transmitted infections (STIs)  Get screened for STIs, including gonorrhea and chlamydia, if: ? You are sexually active and are younger than 35 years of age. ? You are older than 35 years of age and your health care provider tells you that you are at risk for this type  of infection. ? Your sexual activity has changed since you were last screened, and you are at increased risk for chlamydia or gonorrhea. Ask your health care provider if you are at risk.  Ask your health care provider about whether you are at high risk for HIV. Your health care provider may recommend a prescription medicine to help prevent HIV infection. If you choose to take medicine to prevent HIV, you should first get tested for HIV. You should then be tested every 3  months for as long as you are taking the medicine. Pregnancy  If you are about to stop having your period (premenopausal) and you may become pregnant, seek counseling before you get pregnant.  Take 400 to 800 micrograms (mcg) of folic acid every day if you become pregnant.  Ask for birth control (contraception) if you want to prevent pregnancy. Osteoporosis and menopause Osteoporosis is a disease in which the bones lose minerals and strength with aging. This can result in bone fractures. If you are 48 years old or older, or if you are at risk for osteoporosis and fractures, ask your health care provider if you should:  Be screened for bone loss.  Take a calcium or vitamin D supplement to lower your risk of fractures.  Be given hormone replacement therapy (HRT) to treat symptoms of menopause. Follow these instructions at home: Lifestyle  Do not use any products that contain nicotine or tobacco, such as cigarettes, e-cigarettes, and chewing tobacco. If you need help quitting, ask your health care provider.  Do not use street drugs.  Do not share needles.  Ask your health care provider for help if you need support or information about quitting drugs. Alcohol use  Do not drink alcohol if: ? Your health care provider tells you not to drink. ? You are pregnant, may be pregnant, or are planning to become pregnant.  If you drink alcohol: ? Limit how much you use to 0-1 drink a day. ? Limit intake if you are breastfeeding.  Be aware of how much alcohol is in your drink. In the [place] one drink equals one 12 oz bottle of beer (355 mL), one 5 oz glass of wine (148 mL), or one 1 oz glass of hard liquor (44 mL). General instructions  Schedule regular health, dental, and eye exams.  Stay current with your vaccines.  Tell your health care provider if: ? You often feel depressed. ? You have ever been abused or do not feel safe at home. Summary  Adopting a healthy lifestyle and getting  preventive care are important in promoting health and wellness.  Follow your health care provider's instructions about healthy diet, exercising, and getting tested or screened for diseases.  Follow your health care provider's instructions on monitoring your cholesterol and blood pressure. This information is not intended to replace advice given to you by your health care provider. Make sure you discuss any questions you have with your health care provider. Document Revised: 12/18/2017 Document Reviewed: 12/18/2017 Elsevier Patient Education  2020 [employer].

## 2019-01-15 LAB — COMPLETE METABOLIC PANEL WITH GFR
AG Ratio: 2.2 (calc) (ref 1.0–2.5)
ALT: 12 U/L (ref 6–29)
AST: 11 U/L (ref 10–30)
Albumin: 4.6 g/dL (ref 3.6–5.1)
Alkaline phosphatase (APISO): 40 U/L (ref 31–125)
BUN: 14 mg/dL (ref 7–25)
CO2: 25 mmol/L (ref 20–32)
Calcium: 9.3 mg/dL (ref 8.6–10.2)
Chloride: 105 mmol/L (ref 98–110)
Creat: 0.72 mg/dL (ref 0.50–1.10)
GFR, Est African American: 127 mL/min/{1.73_m2} (ref >=60)
GFR, Est Non African American: 109 mL/min/{1.73_m2} (ref >=60)
Globulin: 2.1 g/dL (calc) (ref 1.9–3.7)
Glucose, Bld: 95 mg/dL (ref 65–99)
Potassium: 4 mmol/L (ref 3.5–5.3)
Sodium: 138 mmol/L (ref 135–146)
Total Bilirubin: 0.4 mg/dL (ref 0.2–1.2)
Total Protein: 6.7 g/dL (ref 6.1–8.1)

## 2019-01-15 LAB — LIPID PANEL W/REFLEX DIRECT LDL
Cholesterol: 212 mg/dL — ABNORMAL HIGH (ref <200)
HDL: 39 mg/dL — ABNORMAL LOW (ref >=50)
LDL Cholesterol (Calc): 138 mg/dL (calc) — ABNORMAL HIGH
Non-HDL Cholesterol (Calc): 173 mg/dL (calc) — ABNORMAL HIGH (ref <130)
Total CHOL/HDL Ratio: 5.4 (calc) — ABNORMAL HIGH (ref <5.0)
Triglycerides: 212 mg/dL — ABNORMAL HIGH (ref <150)

## 2019-01-15 LAB — TSH: TSH: 3.39 mIU/L

## 2019-01-16 LAB — CYTOLOGY - PAP
Comment: NEGATIVE
Diagnosis: NEGATIVE
High risk HPV: NEGATIVE

## 2019-01-19 NOTE — Progress Notes (Signed)
Call patient: Your Pap smear is normal. Repeat in 5 years.

## 2019-02-21 DIAGNOSIS — R05 Cough: Secondary | ICD-10-CM | POA: Diagnosis not present

## 2019-02-21 DIAGNOSIS — Z20822 Contact with and (suspected) exposure to covid-19: Secondary | ICD-10-CM | POA: Diagnosis not present

## 2019-02-21 DIAGNOSIS — R519 Headache, unspecified: Secondary | ICD-10-CM | POA: Diagnosis not present

## 2019-02-21 DIAGNOSIS — M791 Myalgia, unspecified site: Secondary | ICD-10-CM | POA: Diagnosis not present

## 2019-07-08 ENCOUNTER — Other Ambulatory Visit: Payer: Self-pay | Admitting: Family Medicine

## 2019-07-27 ENCOUNTER — Other Ambulatory Visit: Payer: Self-pay

## 2019-07-27 ENCOUNTER — Emergency Department
Admission: EM | Admit: 2019-07-27 | Discharge: 2019-07-27 | Disposition: A | Discharge status: Home / Self Care | Payer: BC Managed Care – PPO | Source: Home / Self Care

## 2019-07-27 DIAGNOSIS — W57XXXA Bitten or stung by nonvenomous insect and other nonvenomous arthropods, initial encounter: Secondary | ICD-10-CM | POA: Diagnosis not present

## 2019-07-27 DIAGNOSIS — T63441A Toxic effect of venom of bees, accidental (unintentional), initial encounter: Secondary | ICD-10-CM

## 2019-07-27 MED ORDER — TRIAMCINOLONE ACETONIDE 0.1 % EX CREA: 1.0000 "application " | TOPICAL_CREAM | Freq: Two times a day (BID) | CUTANEOUS | 0 refills | Status: AC

## 2019-07-27 MED ORDER — AMOXICILLIN-POT CLAVULANATE 875-125 MG PO TABS: 1.0000 | ORAL_TABLET | Freq: Two times a day (BID) | ORAL | 0 refills | Status: DC

## 2019-07-27 NOTE — Discharge Instructions (Signed)
  Please take antibiotics as prescribed and be sure to complete entire course even if you start to feel better to ensure infection does not come back.  Follow up with family medicine in 4-5 days if not improving, sooner if worsening.

## 2019-07-27 NOTE — ED Triage Notes (Signed)
Patient presents to Urgent Care with complaints of bee sting to left upper thigh since four days ago. Patient reports the sting is still painful, itchy, and red. Pt states the swelling has not gotten better, area of irritation seems to be spreading. Pt has hx of cellulitis and thinks it is happening again.

## 2019-07-27 NOTE — ED Provider Notes (Signed)
[name]    CSN: [id number] Arrival date & time: 07/27/19  0943      History   Chief Complaint Chief Complaint  Patient presents with  . Insect Bite    Left thigh    HPI KALEAH HAGEMEISTER is a 34 y.o. female.   HPI HIBBA SCHRAM is a 34 y.o. female presenting to UC with c/o 4 days of gradually worsening redness, pain and itching on her Left thigh where she was stung by a yellow jacket.  She has applied over the counter benadryl and cortisone cream without relief.  Hx of cellulitis, pt concerned she is developing cellulitis again.  Denies fever, chills, n/v/d. Pain is 4/10, aching and sore.    Past Medical History:  Diagnosis Date  . Ectopic pregnancy     Patient Active Problem List   Diagnosis Date Noted  . LEG EDEMA 03/14/2010  . COUGH, CHRONIC 08/22/2009  . OTHER ABNORMAL GLUCOSE 09/03/2008  . MIGRAINE W/O AURA W/O INTRACT W/O STAT MIGRNOSUS 08/16/2008  . ASTHMA, INTRINSIC 10/14/2007  . ALLERGIC RHINITIS 08/07/2006    History reviewed. No pertinent surgical history.  OB History    Gravida  1   Para      Term      Preterm      AB      Living        SAB      TAB      Ectopic      Multiple      Live Births               Home Medications    Prior to Admission medications   Medication Sig Start Date End Date Taking? Authorizing Provider  albuterol (VENTOLIN HFA) 108 (90 Base) MCG/ACT inhaler Inhale 2 puffs into the lungs every 6 (six) hours as needed for wheezing or shortness of breath. 11/25/18   Hali Marry, MD  amoxicillin-clavulanate (AUGMENTIN) 875-125 MG tablet Take 1 tablet by mouth 2 (two) times daily. One po bid x 7 days 07/27/19   Noe Gens, PA-C  norethindrone (MICRONOR) 0.35 MG tablet TAKE 1 TABLET BY MOUTH EVERY DAY 07/08/19   Hali Marry, MD  Norethindrone-Mestranol (NECON 1/50, 28,) 1-50 MG-MCG tablet Take 1 tablet by mouth daily. 08/05/18   Hali Marry, MD  triamcinolone cream  (KENALOG) 0.1 % Apply 1 application topically 2 (two) times daily. 07/27/19   Noe Gens, PA-C    Family History Family History  Problem Relation Age of Onset  . Depression Mother   . Hypertension Father   . Diabetes Paternal Grandfather     Social History Social History   Tobacco Use  . Smoking status: Never Smoker  . Smokeless tobacco: Never Used  Substance Use Topics  . Alcohol use: Yes    Comment: rare  . Drug use: Not on file     Allergies   Hydrocodone-acetaminophen   Review of Systems Review of Systems  Constitutional: Negative for chills and fever.  Musculoskeletal: Negative for arthralgias and joint swelling.  Skin: Positive for color change. Negative for wound.     Physical Exam Triage Vital Signs ED Triage Vitals  Enc Vitals Group     BP 07/27/19 1003 120/84     Pulse Rate 07/27/19 1003 85     Resp 07/27/19 1003 16     Temp 07/27/19 1003 98.8 F (37.1 C)     Temp Source 07/27/19 1003 Oral  SpO2 07/27/19 1003 100 %     Weight --      Height --      Head Circumference --      Peak Flow --      Pain Score 07/27/19 1001 4     Pain Loc --      Pain Edu? --      Excl. in [place]? --    No data found.  Updated Vital Signs BP 120/84 (BP Location: Left Arm)   Pulse 85   Temp 98.8 F (37.1 C) (Oral)   Resp 16   SpO2 100%   Visual Acuity Right Eye Distance:   Left Eye Distance:   Bilateral Distance:    Right Eye Near:   Left Eye Near:    Bilateral Near:     Physical Exam Vitals and nursing note reviewed.  Constitutional:      General: She is not in acute distress.    Appearance: Normal appearance. She is well-developed. She is not ill-appearing, toxic-appearing or diaphoretic.  HENT:     Head: Normocephalic and atraumatic.  Cardiovascular:     Rate and Rhythm: Normal rate and regular rhythm.  Pulmonary:     Effort: Pulmonary effort is normal. No respiratory distress.  Musculoskeletal:        General: Tenderness present. Normal  range of motion.     Cervical back: Normal range of motion.  Skin:    General: Skin is warm and dry.     Findings: Erythema present.       Neurological:     Mental Status: She is alert and oriented to person, place, and time.  Psychiatric:        Behavior: Behavior normal.      UC Treatments / Results  Labs (all labs ordered are listed, but only abnormal results are displayed) Labs Reviewed - No data to display  EKG   Radiology No results found.  Procedures Procedures (including critical care time)  Medications Ordered in UC Medications - No data to display  Initial Impression / Assessment and Plan / UC Course  I have reviewed the triage vital signs and the nursing notes.  Pertinent labs & imaging results that were available during my care of the patient were reviewed by me and considered in my medical decision making (see chart for details).     Hx and exam c/w infected bee sting Will tx with Augmentin and triamcinolone cream AVS given  Final Clinical Impressions(s) / UC Diagnoses   Final diagnoses:  Bug bite with infection, initial encounter  Bee sting reaction, accidental or unintentional, initial encounter     Discharge Instructions      Please take antibiotics as prescribed and be sure to complete entire course even if you start to feel better to ensure infection does not come back.  Follow up with family medicine in 4-5 days if not improving, sooner if worsening.    ED Prescriptions    Medication Sig Dispense Auth. Provider   amoxicillin-clavulanate (AUGMENTIN) 875-125 MG tablet Take 1 tablet by mouth 2 (two) times daily. One po bid x 7 days 14 tablet Gerarda Fraction, Erin O, PA-C   triamcinolone cream (KENALOG) 0.1 % Apply 1 application topically 2 (two) times daily. 30 g Noe Gens, [place]     PDMP not reviewed this encounter.   Noe Gens, [place] 07/27/19 1122

## 2019-09-16 ENCOUNTER — Emergency Department: Admit: 2019-09-16 | Discharge status: Absent without leave | Payer: 59 | Source: Home / Self Care

## 2019-09-16 ENCOUNTER — Other Ambulatory Visit: Payer: Self-pay

## 2019-09-16 ENCOUNTER — Emergency Department
Admission: EM | Admit: 2019-09-16 | Discharge: 2019-09-16 | Disposition: A | Discharge status: Home / Self Care | Payer: 59 | Source: Home / Self Care | Attending: Family Medicine | Admitting: Family Medicine

## 2019-09-16 DIAGNOSIS — R0981 Nasal congestion: Secondary | ICD-10-CM | POA: Diagnosis not present

## 2019-09-16 DIAGNOSIS — J3489 Other specified disorders of nose and nasal sinuses: Secondary | ICD-10-CM

## 2019-09-16 DIAGNOSIS — J069 Acute upper respiratory infection, unspecified: Secondary | ICD-10-CM

## 2019-09-16 MED ORDER — PREDNISONE 20 MG PO TABS: ORAL_TABLET | ORAL | 0 refills | Status: DC

## 2019-09-16 MED ORDER — AMOXICILLIN 875 MG PO TABS: 875.0000 mg | ORAL_TABLET | Freq: Two times a day (BID) | ORAL | 0 refills | Status: DC

## 2019-09-16 NOTE — Discharge Instructions (Addendum)
Take plain guaifenesin (1200mg  extended release tabs such as Mucinex) twice daily, with plenty of water, for cough and congestion. Get adequate rest.   May use Afrin nasal spray (or generic oxymetazoline) each morning for about 5 days and then discontinue.  Also recommend using saline nasal spray several times daily and saline nasal irrigation (AYR is a common brand).  Use Flonase nasal spray each morning after using Afrin nasal spray and saline nasal irrigation. Try warm salt water gargles for sore throat.  Stop all antihistamines for now, and other non-prescription cough/cold preparations. May take Delsym Cough Suppressant ("12 Hour Cough Relief") at bedtime for nighttime cough.   Isolate yourself until COVID-19 test result is available.  If your COVID19 test is positive, then you are infected with the novel coronavirus and could give the virus to others.  Please continue isolation at home for at least 10 days since the start of your symptoms.  Once you complete your 10 day quarantine, you may return to normal activities as long as you've not had a fever for over 24 hours (without taking fever reducing medicine) and your symptoms are improving. Please continue good preventive care measures, including:  frequent hand-washing, avoid touching your face, cover coughs/sneezes, stay out of crowds and keep a 6 foot distance from others.  Go to the nearest hospital emergency room if fever/cough/breathlessness are severe or illness seems like a threat to life.

## 2019-09-16 NOTE — ED Provider Notes (Signed)
[name]    CSN: [id number] Arrival date & time: 09/16/19  1605      History   Chief Complaint Chief Complaint  Patient presents with  . Sinus issues    HPI Breanna Mcmillan is a 34 y.o. female.   Patient complains of two day history of sinus congestion, post-nasal drainage, and sinus pain.  She has had a frontal headache today. She has a history of recurrent sinusitis.  The history is provided by the patient.    Past Medical History:  Diagnosis Date  . Ectopic pregnancy     Patient Active Problem List   Diagnosis Date Noted  . LEG EDEMA 03/14/2010  . COUGH, CHRONIC 08/22/2009  . OTHER ABNORMAL GLUCOSE 09/03/2008  . MIGRAINE W/O AURA W/O INTRACT W/O STAT MIGRNOSUS 08/16/2008  . ASTHMA, INTRINSIC 10/14/2007  . ALLERGIC RHINITIS 08/07/2006    History reviewed. No pertinent surgical history.  OB History    Gravida  1   Para      Term      Preterm      AB      Living        SAB      TAB      Ectopic      Multiple      Live Births               Home Medications    Prior to Admission medications   Medication Sig Start Date End Date Taking? Authorizing Provider  albuterol (VENTOLIN HFA) 108 (90 Base) MCG/ACT inhaler Inhale 2 puffs into the lungs every 6 (six) hours as needed for wheezing or shortness of breath. 11/25/18   Hali Marry, MD  amoxicillin (AMOXIL) 875 MG tablet Take 1 tablet (875 mg total) by mouth 2 (two) times daily. 09/16/19   Kandra Nicolas, MD  norethindrone (MICRONOR) 0.35 MG tablet TAKE 1 TABLET BY MOUTH EVERY DAY 07/08/19   Hali Marry, MD  Norethindrone-Mestranol (NECON 1/50, 28,) 1-50 MG-MCG tablet Take 1 tablet by mouth daily. 08/05/18   Hali Marry, MD  predniSONE (DELTASONE) 20 MG tablet Take one tab by mouth twice daily for 4 days, then one daily. Take with food. 09/16/19   Kandra Nicolas, MD  triamcinolone cream (KENALOG) 0.1 % Apply 1 application topically 2 (two) times  daily. 07/27/19   Noe Gens, PA-C    Family History Family History  Problem Relation Age of Onset  . Depression Mother   . Hypertension Father   . Diabetes Paternal Grandfather     Social History Social History   Tobacco Use  . Smoking status: Never Smoker  . Smokeless tobacco: Never Used  Substance Use Topics  . Alcohol use: Yes    Comment: rare  . Drug use: Not on file     Allergies   Hydrocodone-acetaminophen and Codeine   Review of Systems Review of Systems No sore throat No cough No pleuritic pain No wheezing + nasal congestion + post-nasal drainage + sinus pain/pressure No itchy/red eyes No earache No hemoptysis No SOB No fever/chills No nausea No vomiting No abdominal pain No diarrhea No urinary symptoms No skin rash No fatigue No myalgias + headache   Physical Exam Triage Vital Signs ED Triage Vitals  Enc Vitals Group     BP 09/16/19 1627 123/84     Pulse Rate 09/16/19 1627 87     Resp 09/16/19 1627 18     Temp 09/16/19 1627  99.2 F (37.3 C)     Temp Source 09/16/19 1627 Oral     SpO2 09/16/19 1627 100 %     Weight 09/16/19 1628 155 lb (70.3 kg)     Height 09/16/19 1628 5\' 4"  (1.626 m)     Head Circumference --      Peak Flow --      Pain Score 09/16/19 1628 3     Pain Loc --      Pain Edu? --      Excl. in [place]? --    No data found.  Updated Vital Signs BP 123/84 (BP Location: Right Arm)   Pulse 87   Temp 99.2 F (37.3 C) (Oral)   Resp 18   Ht 5\' 4"  (1.626 m)   Wt 70.3 kg   SpO2 100%   BMI 26.61 kg/m   Visual Acuity Right Eye Distance:   Left Eye Distance:   Bilateral Distance:    Right Eye Near:   Left Eye Near:    Bilateral Near:     Physical Exam Nursing notes and Vital Signs reviewed. Appearance:  Patient appears stated age, and in no acute distress Eyes:  Pupils are equal, round, and reactive to light and accomodation.  Extraocular movement is intact.  Conjunctivae are not inflamed  Ears:  Canals  normal.  Tympanic membranes normal.  Nose:  Mildly congested turbinates.   Maxillary sinus tenderness is present.  Pharynx:  Normal Neck:  Supple.  Mildly enlarged lateral nodes are present, tender to palpation on the left.   Lungs:  Clear to auscultation.  Breath sounds are equal.  Moving air well. Heart:  Regular rate and rhythm without murmurs, rubs, or gallops.  Abdomen:  Nontender without masses or hepatosplenomegaly.  Bowel sounds are present.  No CVA or flank tenderness.  Extremities:  No edema.  Skin:  No rash present.   UC Treatments / Results  Labs (all labs ordered are listed, but only abnormal results are displayed) Labs Reviewed  NOVEL CORONAVIRUS, NAA    EKG   Radiology No results found.  Procedures Procedures (including critical care time)  Medications Ordered in UC Medications - No data to display  Initial Impression / Assessment and Plan / UC Course  I have reviewed the triage vital signs and the nursing notes.  Pertinent labs & imaging results that were available during my care of the patient were reviewed by me and considered in my medical decision making (see chart for details).     Benign exam.  Because of her history of chronic nasal congestion and recurring sinusitis, will begin amoxicillin and prednisone. COVID19 PCR pending        Final Clinical Impressions(s) / UC Diagnoses   Final diagnoses:  Nasal congestion with rhinorrhea  Viral URI     Discharge Instructions     Take plain guaifenesin (1200mg  extended release tabs such as Mucinex) twice daily, with plenty of water, for cough and congestion. Get adequate rest.   May use Afrin nasal spray (or generic oxymetazoline) each morning for about 5 days and then discontinue.  Also recommend using saline nasal spray several times daily and saline nasal irrigation (AYR is a common brand).  Use Flonase nasal spray each morning after using Afrin nasal spray and saline nasal irrigation. Try warm salt  water gargles for sore throat.  Stop all antihistamines for now, and other non-prescription cough/cold preparations. May take Delsym Cough Suppressant ("12 Hour Cough Relief") at bedtime for nighttime cough.  Isolate yourself until COVID-19 test result is available.  If your COVID19 test is positive, then you are infected with the novel coronavirus and could give the virus to others.  Please continue isolation at home for at least 10 days since the start of your symptoms.  Once you complete your 10 day quarantine, you may return to normal activities as long as you've not had a fever for over 24 hours (without taking fever reducing medicine) and your symptoms are improving. Please continue good preventive care measures, including:  frequent hand-washing, avoid touching your face, cover coughs/sneezes, stay out of crowds and keep a 6 foot distance from others.  Go to the nearest hospital emergency room if fever/cough/breathlessness are severe or illness seems like a threat to life.    ED Prescriptions    Medication Sig Dispense Auth. Provider   amoxicillin (AMOXIL) 875 MG tablet Take 1 tablet (875 mg total) by mouth 2 (two) times daily. 14 tablet Kandra Nicolas, MD   predniSONE (DELTASONE) 20 MG tablet Take one tab by mouth twice daily for 4 days, then one daily. Take with food. 12 tablet Kandra Nicolas, MD        Kandra Nicolas, MD 09/19/19 [phone]

## 2019-09-16 NOTE — ED Triage Notes (Signed)
Pt c/o sinus pain since Monday night. Post nasal drip. Says it hurts behind her ears as well. Ibuprofen and mucinex prn. Hx of sinus infections.

## 2019-09-19 LAB — NOVEL CORONAVIRUS, NAA: SARS-CoV-2, NAA: NOT DETECTED

## 2019-10-29 ENCOUNTER — Ambulatory Visit (INDEPENDENT_AMBULATORY_CARE_PROVIDER_SITE_OTHER): Payer: 59 | Admitting: Family Medicine

## 2019-10-29 VITALS — BP 120/88 | HR 98 | Ht 64.0 in | Wt 174.0 lb

## 2019-10-29 DIAGNOSIS — N926 Irregular menstruation, unspecified: Secondary | ICD-10-CM

## 2019-10-29 DIAGNOSIS — E282 Polycystic ovarian syndrome: Secondary | ICD-10-CM

## 2019-10-29 DIAGNOSIS — Z23 Encounter for immunization: Secondary | ICD-10-CM | POA: Diagnosis not present

## 2019-10-29 DIAGNOSIS — R5383 Other fatigue: Secondary | ICD-10-CM | POA: Diagnosis not present

## 2019-10-29 DIAGNOSIS — Z30011 Encounter for initial prescription of contraceptive pills: Secondary | ICD-10-CM

## 2019-10-29 DIAGNOSIS — Z6829 Body mass index (BMI) 29.0-29.9, adult: Secondary | ICD-10-CM

## 2019-10-29 DIAGNOSIS — Z309 Encounter for contraceptive management, unspecified: Secondary | ICD-10-CM | POA: Insufficient documentation

## 2019-10-29 MED ORDER — NORETHIN ACE-ETH ESTRAD-FE 1.5-30 MG-MCG PO TABS: 1.0000 | ORAL_TABLET | Freq: Every day | ORAL | 4 refills | Status: DC

## 2019-10-29 MED ORDER — METFORMIN HCL ER 750 MG PO TB24: 750.0000 mg | ORAL_TABLET | Freq: Every day | ORAL | 0 refills | Status: DC

## 2019-10-29 NOTE — Assessment & Plan Note (Signed)
Discussed switching back to a combination estrogen/progesterone.

## 2019-10-29 NOTE — Progress Notes (Signed)
Established Patient Office Visit  Subjective:  Patient ID: Breanna Mcmillan, female    DOB: 1985-02-15  Age: 34 y.o. MRN: [id number]  CC:  Chief Complaint  Patient presents with   Menstrual Problem    HPI DELIGHT BICKLE presents for increased symptoms around the time of her menstrual cycle.  She does have a history of PCOS.  She is just noticing that she is feeling much more fatigued around that time.  She also reports that the week before her.  She is having a lot more pelvic pain and discomfort.  That is also when the fatigue seems the worst.  Also complaining of increase in facial and chin hair.  Is also worried about developing diabetes she has a very strong family history and had gestational diabetes.  She did have treatment for endometriosis back in 2018 to help her get pregnant.  Pap smear is up-to-date she had that in January 2021.  It was normal with negative HPV.  BMI 29.  1 her biggest concerns is her weight.  She really just has not been able to get the weight back off.  She feels like she has been trying to stay active she has been trying to portion control and eat healthy even her husband's been trying to lose weight.  But she cannot seem to get the scale to move.  She is just really frustrated.  Past Medical History:  Diagnosis Date   Ectopic pregnancy     No past surgical history on file.  Family History  Problem Relation Age of Onset   Depression Mother    Hypertension Father    Diabetes Paternal Grandfather     Social History   Socioeconomic History   Marital status: Married    Spouse name: micheal   Number of children: Not on file   Years of education: Not on file   Highest education level: Not on file  Occupational History   Occupation: Glass blower/designer.    Employer: [employer]  Tobacco Use   Smoking status: Never Smoker   Smokeless tobacco: Never Used  Substance and Sexual Activity   Alcohol use: Yes    Comment: rare    Drug use: Not on file   Sexual activity: Yes    Partners: Male  Other Topics Concern   Not on file  Social History Narrative   Father with IMGUS (possible multiple myeloma).  Occ exercise.  1 caffeine drink per day.    Social Determinants of Health   Financial Resource Strain:    Difficulty of Paying Living Expenses: Not on file  Food Insecurity:    Worried About Charity fundraiser in the Last Year: Not on file   [employer] of Food in the Last Year: Not on file  Transportation Needs:    Lack of Transportation (Medical): Not on file   Lack of Transportation (Non-Medical): Not on file  Physical Activity:    Days of Exercise per Week: Not on file   Minutes of Exercise per Session: Not on file  Stress:    Feeling of Stress : Not on file  Social Connections:    Frequency of Communication with Friends and Family: Not on file   Frequency of Social Gatherings with Friends and Family: Not on file   Attends Religious Services: Not on file   Active Member of Clubs or Organizations: Not on file   Attends Archivist Meetings: Not on file   Marital Status: Not on  file  Intimate Partner Violence:    Fear of Current or Ex-Partner: Not on file   Emotionally Abused: Not on file   Physically Abused: Not on file   Sexually Abused: Not on file    Outpatient Medications Prior to Visit  Medication Sig Dispense Refill   albuterol (VENTOLIN HFA) 108 (90 Base) MCG/ACT inhaler Inhale 2 puffs into the lungs every 6 (six) hours as needed for wheezing or shortness of breath. 18 g 1   triamcinolone cream (KENALOG) 0.1 % Apply 1 application topically 2 (two) times daily. 30 g 0   norethindrone (MICRONOR) 0.35 MG tablet TAKE 1 TABLET BY MOUTH EVERY DAY 84 tablet 1   amoxicillin (AMOXIL) 875 MG tablet Take 1 tablet (875 mg total) by mouth 2 (two) times daily. 14 tablet 0   Norethindrone-Mestranol (NECON 1/50, 28,) 1-50 MG-MCG tablet Take 1 tablet by mouth daily. 3 Package 3    predniSONE (DELTASONE) 20 MG tablet Take one tab by mouth twice daily for 4 days, then one daily. Take with food. 12 tablet 0   No facility-administered medications prior to visit.    Allergies  Allergen Reactions   Hydrocodone-Acetaminophen Itching and Nausea And Vomiting    Other; causes migraines     Codeine     ROS Review of Systems    Objective:    Physical Exam Constitutional:      Appearance: She is well-developed.  HENT:     Head: Normocephalic and atraumatic.  Cardiovascular:     Rate and Rhythm: Normal rate and regular rhythm.     Heart sounds: Normal heart sounds.  Pulmonary:     Effort: Pulmonary effort is normal.     Breath sounds: Normal breath sounds.  Skin:    General: Skin is warm and dry.  Neurological:     Mental Status: She is alert and oriented to person, place, and time.  Psychiatric:        Behavior: Behavior normal.     BP 120/88    Pulse 98    Ht '5\' 4"'  (1.626 m)    Wt 174 lb (78.9 kg)    SpO2 100%    BMI 29.87 kg/m  Wt Readings from Last 3 Encounters:  10/29/19 174 lb (78.9 kg)  09/16/19 155 lb (70.3 kg)  01/14/19 172 lb (78 kg)     Health Maintenance Due  Topic Date Due   Hepatitis C Screening  Never done   HIV Screening  Never done    There are no preventive care reminders to display for this patient.  Lab Results  Component Value Date   TSH 3.39 01/14/2019   No results found for: WBC, HGB, HCT, MCV, PLT Lab Results  Component Value Date   NA 138 01/14/2019   K 4.0 01/14/2019   CO2 25 01/14/2019   GLUCOSE 95 01/14/2019   BUN 14 01/14/2019   CREATININE 0.72 01/14/2019   BILITOT 0.4 01/14/2019   ALKPHOS 41 06/07/2015   AST 11 01/14/2019   ALT 12 01/14/2019   PROT 6.7 01/14/2019   ALBUMIN 4.3 06/07/2015   CALCIUM 9.3 01/14/2019   Lab Results  Component Value Date   CHOL 212 (H) 01/14/2019   Lab Results  Component Value Date   HDL 39 (L) 01/14/2019   Lab Results  Component Value Date   LDLCALC 138  (H) 01/14/2019   Lab Results  Component Value Date   TRIG 212 (H) 01/14/2019   Lab Results  Component Value Date  CHOLHDL 5.4 (H) 01/14/2019   No results found for: HGBA1C    Assessment & Plan:   Problem List Items Addressed This Visit      Endocrine   PCOS (polycystic ovarian syndrome) - Primary    Discussed trial of Metformin to see if this helps regulate insulin control.  Can sometimes also help promote weight loss.  As well as balance hormones.  She is also interested in looking at her birth control she is currently on a progesterone only pill.  Did discuss that there are other medications that can also be used for excess hair growth but she wants to hold off on this for now.  Continue with healthy diet and regular exercise.      Relevant Medications   metFORMIN (GLUCOPHAGE-XR) 750 MG 24 hr tablet   Other Relevant Orders   CBC   TSH   Hemoglobin Z0C   Follicle stimulating hormone   Luteinizing hormone     Other   Contraception management    Discussed switching back to a combination estrogen/progesterone.      BMI 29.0-29.9,adult    Discussed options.  I do think that her PCOS is making it more difficult for her to lose weight.  Discussed starting Metformin continuing to work on dietary changes and portion control and regular exercise.  Plan to follow-up in 3 months and we can discuss alternative options at that point if she is not feeling like she is moving in the right direction.       Other Visit Diagnoses    Flu vaccine need       Relevant Orders   Flu Vaccine QUAD 36+ mos IM (Completed)   Fatigue, unspecified type       Relevant Orders   CBC   TSH   Hemoglobin H8N   Follicle stimulating hormone   Luteinizing hormone   Irregular periods       Relevant Orders   CBC   TSH   Hemoglobin I7P   Follicle stimulating hormone   Luteinizing hormone      Meds ordered this encounter  Medications   metFORMIN (GLUCOPHAGE-XR) 750 MG 24 hr tablet    Sig: Take  1 tablet (750 mg total) by mouth daily with breakfast.    Dispense:  90 tablet    Refill:  0   norethindrone-ethinyl estradiol-iron (HAILEY FE 1.5/30) 1.5-30 MG-MCG tablet    Sig: Take 1 tablet by mouth daily.    Dispense:  84 tablet    Refill:  4    Follow-up: Return in about 3 months (around 01/29/2020) for weight change.    Beatrice Lecher, MD

## 2019-10-29 NOTE — Assessment & Plan Note (Addendum)
Discussed trial of Metformin to see if this helps regulate insulin control.  Can sometimes also help promote weight loss.  As well as balance hormones.  She is also interested in looking at her birth control she is currently on a progesterone only pill.  Did discuss that there are other medications that can also be used for excess hair growth but she wants to hold off on this for now.  Continue with healthy diet and regular exercise.

## 2019-10-29 NOTE — Assessment & Plan Note (Signed)
Discussed options.  I do think that her PCOS is making it more difficult for her to lose weight.  Discussed starting Metformin continuing to work on dietary changes and portion control and regular exercise.  Plan to follow-up in 3 months and we can discuss alternative options at that point if she is not feeling like she is moving in the right direction.

## 2019-10-30 LAB — HEMOGLOBIN A1C
Hgb A1c MFr Bld: 5.3 % of total Hgb (ref <5.7)
Mean Plasma Glucose: 105 (calc)
eAG (mmol/L): 5.8 (calc)

## 2019-10-30 LAB — CBC
HCT: 39.8 % (ref 35.0–45.0)
Hemoglobin: 13 g/dL (ref 11.7–15.5)
MCH: 26.9 pg — ABNORMAL LOW (ref 27.0–33.0)
MCHC: 32.7 g/dL (ref 32.0–36.0)
MCV: 82.4 fL (ref 80.0–100.0)
MPV: 9.4 fL (ref 7.5–12.5)
Platelets: 264 10*3/uL (ref 140–400)
RBC: 4.83 10*6/uL (ref 3.80–5.10)
RDW: 13 % (ref 11.0–15.0)
WBC: 5.2 10*3/uL (ref 3.8–10.8)

## 2019-10-30 LAB — TSH: TSH: 2.57 mIU/L

## 2019-10-30 LAB — LUTEINIZING HORMONE: LH: 3.5 m[IU]/mL

## 2019-10-30 LAB — FOLLICLE STIMULATING HORMONE: FSH: 2.7 m[IU]/mL

## 2019-12-18 ENCOUNTER — Other Ambulatory Visit: Payer: Self-pay | Admitting: Family Medicine

## 2020-01-10 ENCOUNTER — Encounter: Payer: Self-pay | Admitting: Family Medicine

## 2020-01-11 ENCOUNTER — Other Ambulatory Visit: Payer: Self-pay | Admitting: Family Medicine

## 2020-01-11 ENCOUNTER — Emergency Department (INDEPENDENT_AMBULATORY_CARE_PROVIDER_SITE_OTHER)
Admission: EM | Admit: 2020-01-11 | Discharge: 2020-01-11 | Disposition: A | Discharge status: Home / Self Care | Payer: 59 | Source: Home / Self Care

## 2020-01-11 ENCOUNTER — Telehealth: Payer: 59 | Admitting: Family

## 2020-01-11 ENCOUNTER — Other Ambulatory Visit: Payer: Self-pay

## 2020-01-11 ENCOUNTER — Emergency Department: Admit: 2020-01-11 | Discharge status: Absent without leave | Payer: Self-pay

## 2020-01-11 DIAGNOSIS — R109 Unspecified abdominal pain: Secondary | ICD-10-CM | POA: Diagnosis not present

## 2020-01-11 DIAGNOSIS — R300 Dysuria: Secondary | ICD-10-CM

## 2020-01-11 DIAGNOSIS — R3129 Other microscopic hematuria: Secondary | ICD-10-CM

## 2020-01-11 DIAGNOSIS — U071 COVID-19: Secondary | ICD-10-CM

## 2020-01-11 LAB — POCT URINALYSIS DIP (MANUAL ENTRY)
Bilirubin, UA: NEGATIVE
Glucose, UA: NEGATIVE mg/dL
Ketones, POC UA: NEGATIVE mg/dL
Leukocytes, UA: NEGATIVE
Nitrite, UA: NEGATIVE
Protein Ur, POC: NEGATIVE mg/dL
Spec Grav, UA: 1.01 (ref 1.010–1.025)
Urobilinogen, UA: 0.2 E.U./dL
pH, UA: 6.5 (ref 5.0–8.0)

## 2020-01-11 MED ORDER — LEVALBUTEROL TARTRATE 45 MCG/ACT IN AERO: 1.0000 | INHALATION_SPRAY | Freq: Four times a day (QID) | RESPIRATORY_TRACT | 1 refills | Status: DC | PRN

## 2020-01-11 NOTE — Progress Notes (Signed)
Based on what you shared with me, I feel your condition warrants further evaluation and I recommend that you be seen for a face to face office visit.   Given you are having UTI symptoms and back pain you need to be seen face to face for further testing.    NOTE: If you entered your credit card information for this eVisit, you will not be charged. You may see a "hold" on your card for the $35 but that hold will drop off and you will not have a charge processed.   If you are having a true medical emergency please call 911.      For an urgent face to face visit, [facility] has five urgent care centers for your convenience:     [facility] at [facility] Directions [phone] [address] [place], [place] [address] . 10 am - 6pm Monday - Friday    [facility]) Get Driving Directions [phone] [address] [place], [place] [address] . 10 am to 8 pm Monday-Friday . 12 pm to 8 pm [facility] at [facility] Get Driving Directions [phone] [address], [address] [place], [place] [address] . 8 am to 8 pm Monday-Friday . 9 am to 6 pm Saturday . 11 am to 6 pm Sunday     [facility] at [facility] Get Driving Directions  [phone] [address] [place], [place] [address] . 8 am to 8 pm Monday-Friday . 8 am to 4 pm [facility] Ur[facility] at [facility] Directions [phone] [address] [place], [place] [address] . 12 pm to 6 pm Monday-Friday      Your e-visit answers were reviewed by a board certified advanced clinical practitioner to complete your personal care plan.  Thank you for using e-Visits.

## 2020-01-11 NOTE — ED Provider Notes (Signed)
[name]    CSN: [id number] Arrival date & time: 01/11/20  1929      History   Chief Complaint Chief Complaint  Patient presents with  . Dysuria    HPI Breanna Mcmillan is a 35 y.o. female.   HPI Breanna Mcmillan is a 35 y.o. female presenting to UC with c/o bilateral lower back pain and mild dysuria that started earlier today. Pt reports hx of UTIs but also notes she tested positive for COVID on 01/07/20.  Pt developed URI symptoms of cough, congestion and fatigue, which triggered her to take an at home COVID test, which was positive. Denies fever, chills, n/v/d. No chest pain or SOB.    Past Medical History:  Diagnosis Date  . Ectopic pregnancy     Patient Active Problem List   Diagnosis Date Noted  . BMI 29.0-29.9,adult 10/29/2019  . PCOS (polycystic ovarian syndrome) 10/29/2019  . Contraception management 10/29/2019  . LEG EDEMA 03/14/2010  . COUGH, CHRONIC 08/22/2009  . OTHER ABNORMAL GLUCOSE 09/03/2008  . MIGRAINE W/O AURA W/O INTRACT W/O STAT MIGRNOSUS 08/16/2008  . ASTHMA, INTRINSIC 10/14/2007  . ALLERGIC RHINITIS 08/07/2006    History reviewed. No pertinent surgical history.  OB History    Gravida  1   Para      Term      Preterm      AB      Living        SAB      IAB      Ectopic      Multiple      Live Births               Home Medications    Prior to Admission medications   Medication Sig Start Date End Date Taking? Authorizing Provider  levalbuterol [facility] HFA) 45 MCG/ACT inhaler Inhale 1-2 puffs into the lungs every 6 (six) hours as needed for wheezing. 01/11/20   Hali Marry, MD  metFORMIN (GLUCOPHAGE-XR) 750 MG 24 hr tablet Take 1 tablet (750 mg total) by mouth daily with breakfast. 10/29/19   Hali Marry, MD  norethindrone-ethinyl estradiol-iron (HAILEY FE 1.5/30) 1.5-30 MG-MCG tablet Take 1 tablet by mouth daily. 10/29/19   Hali Marry, MD  triamcinolone cream (KENALOG) 0.1 %  Apply 1 application topically 2 (two) times daily. 07/27/19   Noe Gens, PA-C  albuterol (VENTOLIN HFA) 108 (90 Base) MCG/ACT inhaler Inhale 2 puffs into the lungs every 6 (six) hours as needed for wheezing or shortness of breath. 11/25/18 01/11/20  Hali Marry, MD    Family History Family History  Problem Relation Age of Onset  . Depression Mother   . Hypertension Father   . Diabetes Paternal Grandfather     Social History Social History   Tobacco Use  . Smoking status: Never Smoker  . Smokeless tobacco: Never Used  Substance Use Topics  . Alcohol use: Yes    Comment: rare     Allergies   Hydrocodone-acetaminophen and Codeine   Review of Systems Review of Systems  Constitutional: Positive for fatigue. Negative for chills and fever.  HENT: Positive for congestion and sore throat. Negative for ear pain, trouble swallowing and voice change.   Respiratory: Positive for cough. Negative for shortness of breath.   Cardiovascular: Negative for chest pain and palpitations.  Gastrointestinal: Negative for abdominal pain, diarrhea, nausea and vomiting.  Genitourinary: Positive for dysuria and flank pain. Negative for frequency, hematuria, urgency, vaginal bleeding,  vaginal discharge and vaginal pain.  Musculoskeletal: Positive for back pain and myalgias. Negative for arthralgias.  Skin: Negative for rash.  All other systems reviewed and are negative.    Physical Exam Triage Vital Signs ED Triage Vitals  Enc Vitals Group     BP 01/11/20 2004 (!) 149/97     Pulse Rate 01/11/20 2004 98     Resp 01/11/20 2004 15     Temp 01/11/20 2004 98.8 F (37.1 C)     Temp Source 01/11/20 2004 Oral     SpO2 01/11/20 2004 99 %     Weight --      Height --      Head Circumference --      Peak Flow --      Pain Score 01/11/20 2003 7     Pain Loc --      Pain Edu? --      Excl. in [place]? --    No data found.  Updated Vital Signs BP (!) 149/97 (BP Location: Right Arm)    Pulse 98   Temp 98.8 F (37.1 C) (Oral)   Resp 15   SpO2 99%   Visual Acuity Right Eye Distance:   Left Eye Distance:   Bilateral Distance:    Right Eye Near:   Left Eye Near:    Bilateral Near:     Physical Exam Vitals and nursing note reviewed.  Constitutional:      General: She is not in acute distress.    Appearance: Normal appearance. She is well-developed and well-nourished. She is not ill-appearing, toxic-appearing or diaphoretic.  HENT:     Head: Normocephalic and atraumatic.     Right Ear: Tympanic membrane and ear canal normal.     Left Ear: Tympanic membrane and ear canal normal.     Nose: Nose normal.     Right Sinus: No maxillary sinus tenderness or frontal sinus tenderness.     Left Sinus: No maxillary sinus tenderness or frontal sinus tenderness.     Mouth/Throat:     Lips: Pink.     Mouth: Mucous membranes are moist.     Pharynx: Oropharynx is clear. Uvula midline. No pharyngeal swelling, oropharyngeal exudate, posterior oropharyngeal erythema or uvula swelling.  Eyes:     Extraocular Movements: EOM normal.  Cardiovascular:     Rate and Rhythm: Normal rate and regular rhythm.  Pulmonary:     Effort: Pulmonary effort is normal. No respiratory distress.     Breath sounds: Normal breath sounds. No stridor. No wheezing, rhonchi or rales.  Abdominal:     General: There is no distension.     Palpations: Abdomen is soft.     Tenderness: There is no abdominal tenderness. There is no right CVA tenderness or left CVA tenderness.  Musculoskeletal:        General: Normal range of motion.     Cervical back: Normal range of motion and neck supple.  Skin:    General: Skin is warm and dry.  Neurological:     Mental Status: She is alert and oriented to person, place, and time.  Psychiatric:        Mood and Affect: Mood and affect normal.        Behavior: Behavior normal.      UC Treatments / Results  Labs (all labs ordered are listed, but only abnormal results  are displayed) Labs Reviewed  POCT URINALYSIS DIP (MANUAL ENTRY) - Abnormal; Notable for the following components:  Result Value   Blood, UA trace-intact (*)    All other components within normal limits    EKG   Radiology No results found.  Procedures Procedures (including critical care time)  Medications Ordered in UC Medications - No data to display  Initial Impression / Assessment and Plan / UC Course  I have reviewed the triage vital signs and the nursing notes.  Pertinent labs & imaging results that were available during my care of the patient were reviewed by me and considered in my medical decision making (see chart for details).     UA not c/w UTI Suspect mild dehydration vs small kidney stone, hx of stones in the past Encouraged good hydration  F/u later this week if not improving or new symptoms develop AVS given  Final Clinical Impressions(s) / UC Diagnoses   Final diagnoses:  Microscopic hematuria  Bilateral flank pain  Dysuria  COVID     Discharge Instructions      Be sure to stay well hydrated and get plenty of rest.  Follow up with primary care as needed.  Call 911 or have someone drive you to the hospital if symptoms significantly worsening.     ED Prescriptions    None     PDMP not reviewed this encounter.   Lurene Shadow, [place] 01/11/20 2116

## 2020-01-11 NOTE — Discharge Instructions (Signed)
°  Be sure to stay well hydrated and get plenty of rest.  Follow up with primary care as needed.  Call 911 or have someone drive you to the hospital if symptoms significantly worsening.

## 2020-01-11 NOTE — ED Triage Notes (Signed)
Patient presents to Urgent Care with complaints of dysuria since earlier today. Patient reports she thinks she may have a UTI.

## 2020-01-12 NOTE — Telephone Encounter (Signed)
Pharmacy sent refill request to be changed due to medication not being on pt's formulary. Will send requested medication.    Pharmacy comment: Alternative Requested:DRUG IS NOT FORMULARY-PLEASE CHANGE RX.

## 2020-01-13 ENCOUNTER — Telehealth: Payer: 59 | Admitting: Family Medicine

## 2020-01-13 NOTE — Telephone Encounter (Signed)
Please advise 

## 2020-01-20 ENCOUNTER — Ambulatory Visit (INDEPENDENT_AMBULATORY_CARE_PROVIDER_SITE_OTHER): Payer: 59 | Admitting: Physician Assistant

## 2020-01-20 ENCOUNTER — Encounter: Payer: Self-pay | Admitting: Physician Assistant

## 2020-01-20 ENCOUNTER — Other Ambulatory Visit: Payer: Self-pay

## 2020-01-20 ENCOUNTER — Other Ambulatory Visit: Payer: Self-pay | Admitting: Family Medicine

## 2020-01-20 ENCOUNTER — Ambulatory Visit (INDEPENDENT_AMBULATORY_CARE_PROVIDER_SITE_OTHER): Payer: 59

## 2020-01-20 VITALS — BP 144/93 | HR 98 | Temp 98.7°F | Wt 168.4 lb

## 2020-01-20 DIAGNOSIS — U071 COVID-19: Secondary | ICD-10-CM

## 2020-01-20 DIAGNOSIS — J40 Bronchitis, not specified as acute or chronic: Secondary | ICD-10-CM

## 2020-01-20 DIAGNOSIS — R059 Cough, unspecified: Secondary | ICD-10-CM

## 2020-01-20 DIAGNOSIS — J329 Chronic sinusitis, unspecified: Secondary | ICD-10-CM | POA: Diagnosis not present

## 2020-01-20 DIAGNOSIS — R5383 Other fatigue: Secondary | ICD-10-CM

## 2020-01-20 DIAGNOSIS — M549 Dorsalgia, unspecified: Secondary | ICD-10-CM | POA: Diagnosis not present

## 2020-01-20 DIAGNOSIS — E282 Polycystic ovarian syndrome: Secondary | ICD-10-CM

## 2020-01-20 MED ORDER — AZITHROMYCIN 250 MG PO TABS: ORAL_TABLET | ORAL | 0 refills | Status: DC

## 2020-01-20 MED ORDER — PREDNISONE 50 MG PO TABS: ORAL_TABLET | ORAL | 0 refills | Status: DC

## 2020-01-20 NOTE — Progress Notes (Signed)
CXR shows clear lungs. Treatment plan stays the same.

## 2020-01-20 NOTE — Progress Notes (Addendum)
Acute Office Visit  Subjective:    Patient ID: Breanna Mcmillan, female    DOB: 06-25-1985, 35 y.o.   MRN: [id number]   Breanna Mcmillan is a 35 year old woman w/ hist of asthma and 2 week hist of COVID19 infection presenting with continued upper resp sx, cough, right flank/lower chest pain on inspiration.  She was seen at urgent care here on 1/3 where she was found to have a small amt of blood in urine attributed to dehydration or small kidney stones.  She presents on day 14 of infection with little improvement.  Cough is not productive but she is able to clear her nose which produces a green snot.  Several days ago she noticed a hernia after a bowel movement but did not feel as though her movement was strained enough to cause one.  She has been taking OTC hernia and stool softeners.  At night pt reports waking up and having trouble staying asleep.  She has not had a fever, nausea, vomiting, diarrhea, and denies rash.     Past Medical History:  Diagnosis Date   Ectopic pregnancy     History reviewed. No pertinent surgical history.  Family History  Problem Relation Age of Onset   Depression Mother    Hypertension Father    Diabetes Paternal Grandfather     Social History   Socioeconomic History   Marital status: Married    Spouse name: micheal   Number of children: Not on file   Years of education: Not on file   Highest education level: Not on file  Occupational History   Occupation: Glass blower/designer.    Employer: [employer]  Tobacco Use   Smoking status: Never Smoker   Smokeless tobacco: Never Used  Substance and Sexual Activity   Alcohol use: Yes    Comment: rare   Drug use: Not on file   Sexual activity: Yes    Partners: Male  Other Topics Concern   Not on file  Social History Narrative   Father with IMGUS (possible multiple myeloma).  Occ exercise.  1 caffeine drink per day.    Social Determinants of Health   Financial Resource Strain: Not on  file  Food Insecurity: Not on file  Transportation Needs: Not on file  Physical Activity: Not on file  Stress: Not on file  Social Connections: Not on file  Intimate Partner Violence: Not on file    Outpatient Medications Prior to Visit  Medication Sig Dispense Refill   albuterol (PROAIR HFA) 108 (90 Base) MCG/ACT inhaler Inhale 1-2 puffs into the lungs every 6 (six) hours as needed for wheezing or shortness of breath. 18 g 1   norethindrone-ethinyl estradiol-iron (HAILEY FE 1.5/30) 1.5-30 MG-MCG tablet Take 1 tablet by mouth daily. 84 tablet 4   triamcinolone cream (KENALOG) 0.1 % Apply 1 application topically 2 (two) times daily. 30 g 0   metFORMIN (GLUCOPHAGE-XR) 750 MG 24 hr tablet TAKE 1 TABLET BY MOUTH EVERY DAY WITH BREAKFAST (Patient not taking: Reported on 01/20/2020) 90 tablet 0   No facility-administered medications prior to visit.    Allergies  Allergen Reactions   Hydrocodone-Acetaminophen Itching and Nausea And Vomiting    Other; causes migraines     Codeine     Review of Systems  Constitutional: Positive for fatigue. Negative for fever.  HENT: Positive for congestion, rhinorrhea, sinus pressure and sore throat.   Eyes: Negative for redness and itching.  Respiratory: Positive for cough. Negative for chest  tightness, shortness of breath and wheezing.   Cardiovascular: Positive for chest pain. Negative for palpitations.  Gastrointestinal: Positive for rectal pain. Negative for abdominal pain, constipation, diarrhea, nausea and vomiting.  Endocrine: Negative for polyuria.  Genitourinary: Positive for flank pain. Negative for decreased urine volume, dysuria, frequency, hematuria and urgency.  Musculoskeletal: Positive for back pain. Negative for neck pain and neck stiffness.  Skin: Negative for color change and rash.  Neurological: Negative for syncope and light-headedness.  Psychiatric/Behavioral: Positive for sleep disturbance. Negative for confusion and  dysphoric mood. The patient is not nervous/anxious.        Objective:    Physical Exam Vitals reviewed.  Constitutional:      Appearance: Normal appearance. She is not toxic-appearing or diaphoretic.  HENT:     Head: Normocephalic.     Right Ear: Tympanic membrane normal.     Left Ear: Tympanic membrane normal.     Nose: Congestion and rhinorrhea present.     Mouth/Throat:     Mouth: Mucous membranes are moist.  Eyes:     General: No scleral icterus.    Extraocular Movements: Extraocular movements intact.     Pupils: Pupils are equal, round, and reactive to light.  Neck:     Vascular: No carotid bruit.  Cardiovascular:     Rate and Rhythm: Normal rate and regular rhythm.     Pulses: Normal pulses.     Heart sounds: Normal heart sounds.  Pulmonary:     Effort: Pulmonary effort is normal.     Breath sounds: Normal breath sounds. No stridor. No wheezing or rhonchi.    Chest:     Chest wall: Tenderness present.  Abdominal:     General: Abdomen is flat. There is no distension.     Palpations: Abdomen is soft.     Tenderness: There is no abdominal tenderness. There is right CVA tenderness. There is no left CVA tenderness.  Musculoskeletal:        General: No swelling or signs of injury.     Cervical back: No rigidity or tenderness.  Skin:    General: Skin is warm and dry.     Capillary Refill: Capillary refill takes less than 2 seconds.  Neurological:     General: No focal deficit present.     Mental Status: She is alert and oriented to person, place, and time. Mental status is at baseline.     Cranial Nerves: No cranial nerve deficit.     Motor: No weakness.     Gait: Gait normal.  Psychiatric:        Mood and Affect: Mood normal.        Behavior: Behavior normal.        Thought Content: Thought content normal.        Judgment: Judgment normal.     BP (!) 144/93    Pulse 98    Temp 98.7 F (37.1 C)    Wt 168 lb 6.4 oz (76.4 kg)    SpO2 98%    BMI 28.91 kg/m  Wt  Readings from Last 3 Encounters:  01/20/20 168 lb 6.4 oz (76.4 kg)  10/29/19 174 lb (78.9 kg)  09/16/19 155 lb (70.3 kg)    Health Maintenance Due  Topic Date Due   Hepatitis C Screening  Never done   HIV Screening  Never done   COVID-19 Vaccine (3 - Booster for [place] series) 10/22/2019        Assessment & Plan:  Marland KitchenMarland KitchenDiagnoses and  all orders for this visit:  Sinobronchitis -     azithromycin (ZITHROMAX Z-PAK) 250 MG tablet; Take 2 tablets (500 mg) on  Day 1,  followed by 1 tablet (250 mg) once daily on Days 2 through 5. -     predniSONE (DELTASONE) 50 MG tablet; One tab PO daily for 5 days.  Mid back pain on right side -     DG Chest 2 View -     COMPLETE METABOLIC PANEL WITH GFR -     CBC with Differential/Platelet  Cough -     DG Chest 2 View -     COMPLETE METABOLIC PANEL WITH GFR -     CBC with Differential/Platelet  COVID-19 virus infection -     DG Chest 2 View -     COMPLETE METABOLIC PANEL WITH GFR -     CBC with Differential/Platelet  Fatigue, unspecified type -     COMPLETE METABOLIC PANEL WITH GFR -     CBC with Differential/Platelet   Concern for post-covid pneumonia due to symptom duration and exam today.  CXR was negative for pneumonia.  Treated with zpak, prednisone Cbc and cmp done to check organ function.  No signs of PE with tachycardia, calf pain or swelling, significant SOB.  Follow up as needed.     Marland KitchenVernetta Honey PA-C, have reviewed and agree with the above documentation in it's entirety.

## 2020-01-21 LAB — CBC WITH DIFFERENTIAL/PLATELET
Absolute Monocytes: 462 cells/uL (ref 200–950)
Basophils Absolute: 60 cells/uL (ref 0–200)
Basophils Relative: 0.9 %
Eosinophils Absolute: 67 cells/uL (ref 15–500)
Eosinophils Relative: 1 %
HCT: 40.1 % (ref 35.0–45.0)
Hemoglobin: 13.5 g/dL (ref 11.7–15.5)
Lymphs Abs: 1655 cells/uL (ref 850–3900)
MCH: 26.6 pg — ABNORMAL LOW (ref 27.0–33.0)
MCHC: 33.7 g/dL (ref 32.0–36.0)
MCV: 79.1 fL — ABNORMAL LOW (ref 80.0–100.0)
MPV: 9.6 fL (ref 7.5–12.5)
Monocytes Relative: 6.9 %
Neutro Abs: 4456 cells/uL (ref 1500–7800)
Neutrophils Relative %: 66.5 %
Platelets: 357 10*3/uL (ref 140–400)
RBC: 5.07 10*6/uL (ref 3.80–5.10)
RDW: 12.4 % (ref 11.0–15.0)
Total Lymphocyte: 24.7 %
WBC: 6.7 10*3/uL (ref 3.8–10.8)

## 2020-01-21 LAB — COMPLETE METABOLIC PANEL WITH GFR
AG Ratio: 1.6 (calc) (ref 1.0–2.5)
ALT: 9 U/L (ref 6–29)
AST: 9 U/L — ABNORMAL LOW (ref 10–30)
Albumin: 4.2 g/dL (ref 3.6–5.1)
Alkaline phosphatase (APISO): 38 U/L (ref 31–125)
BUN: 9 mg/dL (ref 7–25)
CO2: 28 mmol/L (ref 20–32)
Calcium: 9.2 mg/dL (ref 8.6–10.2)
Chloride: 104 mmol/L (ref 98–110)
Creat: 0.75 mg/dL (ref 0.50–1.10)
GFR, Est African American: 120 mL/min/{1.73_m2} (ref >=60)
GFR, Est Non African American: 103 mL/min/{1.73_m2} (ref >=60)
Globulin: 2.6 g/dL (calc) (ref 1.9–3.7)
Glucose, Bld: 96 mg/dL (ref 65–99)
Potassium: 3.9 mmol/L (ref 3.5–5.3)
Sodium: 139 mmol/L (ref 135–146)
Total Bilirubin: 0.2 mg/dL (ref 0.2–1.2)
Total Protein: 6.8 g/dL (ref 6.1–8.1)

## 2020-01-21 NOTE — Progress Notes (Signed)
No concerns with labs. You should be feeling better soon. Covid can leave linguring sinus congestion and symptoms though for weeks but abx should kill anything bacterial.

## 2020-01-22 ENCOUNTER — Encounter: Payer: Self-pay | Admitting: Physician Assistant

## 2020-01-26 ENCOUNTER — Encounter: Payer: Self-pay | Admitting: Physician Assistant

## 2020-02-02 ENCOUNTER — Encounter: Payer: Self-pay | Admitting: Family Medicine

## 2020-02-02 ENCOUNTER — Other Ambulatory Visit: Payer: Self-pay

## 2020-02-02 ENCOUNTER — Ambulatory Visit (INDEPENDENT_AMBULATORY_CARE_PROVIDER_SITE_OTHER): Payer: 59 | Admitting: Family Medicine

## 2020-02-02 VITALS — BP 130/78 | HR 81 | Ht 64.0 in | Wt 170.0 lb

## 2020-02-02 DIAGNOSIS — R635 Abnormal weight gain: Secondary | ICD-10-CM | POA: Diagnosis not present

## 2020-02-02 DIAGNOSIS — Z6829 Body mass index (BMI) 29.0-29.9, adult: Secondary | ICD-10-CM | POA: Diagnosis not present

## 2020-02-02 MED ORDER — WEGOVY 0.25 MG/0.5ML ~~LOC~~ SOAJ: 0.2500 mg | SUBCUTANEOUS | 0 refills | Status: DC

## 2020-02-02 NOTE — Progress Notes (Signed)
Established Patient Office Visit  Subjective:  Patient ID: Breanna Mcmillan, female    DOB: 04-05-1985  Age: 35 y.o. MRN: [id number]  CC:  Chief Complaint  Patient presents with  . polycystic ovaria syndrome  . Weight Check    She reports that she tried taking the Metformin but it caused a lot of GI upset     HPI Breanna Mcmillan presents for follow-up of elevated BMI.  When I last saw her in October BMI was 29 she did have a history of PCOS and we had discussed a trial of Metformin to see if this would help in addition to working on some dietary changes and setting some goals for portion control as well as regular exercise and encouraged her to follow-up in 3 months if she felt like she was still struggling want to discuss alternative options.  She was just really frustrated that she was not making a lot of progress with some of the changes that she had already made.  Metformin caused GI upset and couldn't tolerate it. Tried for over a months.  Caused diarrhea   Past Medical History:  Diagnosis Date  . Ectopic pregnancy     No past surgical history on file.  Family History  Problem Relation Age of Onset  . Depression Mother   . Hypertension Father   . Diabetes Paternal Grandfather     Social History   Socioeconomic History  . Marital status: Married    Spouse name: micheal  . Number of children: Not on file  . Years of education: Not on file  . Highest education level: Not on file  Occupational History  . Occupation: Glass blower/designer.    Employer: [employer]  Tobacco Use  . Smoking status: Never Smoker  . Smokeless tobacco: Never Used  Substance and Sexual Activity  . Alcohol use: Yes    Comment: rare  . Drug use: Not on file  . Sexual activity: Yes    Partners: Male  Other Topics Concern  . Not on file  Social History Narrative   Father with IMGUS (possible multiple myeloma).  Occ exercise.  1 caffeine drink per day.    Social Determinants of Health    Financial Resource Strain: Not on file  Food Insecurity: Not on file  Transportation Needs: Not on file  Physical Activity: Not on file  Stress: Not on file  Social Connections: Not on file  Intimate Partner Violence: Not on file    Outpatient Medications Prior to Visit  Medication Sig Dispense Refill  . albuterol (PROAIR HFA) 108 (90 Base) MCG/ACT inhaler Inhale 1-2 puffs into the lungs every 6 (six) hours as needed for wheezing or shortness of breath. 18 g 1  . norethindrone-ethinyl estradiol-iron (HAILEY FE 1.5/30) 1.5-30 MG-MCG tablet Take 1 tablet by mouth daily. 84 tablet 4  . triamcinolone cream (KENALOG) 0.1 % Apply 1 application topically 2 (two) times daily. 30 g 0  . azithromycin (ZITHROMAX Z-PAK) 250 MG tablet Take 2 tablets (500 mg) on  Day 1,  followed by 1 tablet (250 mg) once daily on Days 2 through 5. 6 tablet 0  . metFORMIN (GLUCOPHAGE-XR) 750 MG 24 hr tablet TAKE 1 TABLET BY MOUTH EVERY DAY WITH BREAKFAST (Patient not taking: Reported on 01/20/2020) 90 tablet 0  . predniSONE (DELTASONE) 50 MG tablet One tab PO daily for 5 days. 5 tablet 0   No facility-administered medications prior to visit.    Allergies  Allergen Reactions  .  Hydrocodone-Acetaminophen Itching and Nausea And Vomiting    Other; causes migraines    . Codeine   . Glucophage Xr [Metformin Hcl Er] Diarrhea    ROS Review of Systems    Objective:    Physical Exam Constitutional:      Appearance: She is well-developed and well-nourished.  HENT:     Head: Normocephalic and atraumatic.  Cardiovascular:     Rate and Rhythm: Normal rate and regular rhythm.     Heart sounds: Normal heart sounds.  Pulmonary:     Effort: Pulmonary effort is normal.     Breath sounds: Normal breath sounds.  Skin:    General: Skin is warm and dry.  Neurological:     Mental Status: She is alert and oriented to person, place, and time.  Psychiatric:        Mood and Affect: Mood and affect normal.         Behavior: Behavior normal.     BP 130/78   Pulse 81   Ht '5\' 4"'  (1.626 m)   Wt 170 lb (77.1 kg)   SpO2 100%   BMI 29.18 kg/m  Wt Readings from Last 3 Encounters:  02/02/20 170 lb (77.1 kg)  01/20/20 168 lb 6.4 oz (76.4 kg)  10/29/19 174 lb (78.9 kg)     Health Maintenance Due  Topic Date Due  . Hepatitis C Screening  Never done  . HIV Screening  Never done  . COVID-19 Vaccine (3 - Booster for Pfizer series) 10/22/2019    There are no preventive care reminders to display for this patient.  Lab Results  Component Value Date   TSH 2.57 10/29/2019   Lab Results  Component Value Date   WBC 6.7 01/20/2020   HGB 13.5 01/20/2020   HCT 40.1 01/20/2020   MCV 79.1 (L) 01/20/2020   PLT 357 01/20/2020   Lab Results  Component Value Date   NA 139 01/20/2020   K 3.9 01/20/2020   CO2 28 01/20/2020   GLUCOSE 96 01/20/2020   BUN 9 01/20/2020   CREATININE 0.75 01/20/2020   BILITOT 0.2 01/20/2020   ALKPHOS 41 06/07/2015   AST 9 (L) 01/20/2020   ALT 9 01/20/2020   PROT 6.8 01/20/2020   ALBUMIN 4.3 06/07/2015   CALCIUM 9.2 01/20/2020   Lab Results  Component Value Date   CHOL 212 (H) 01/14/2019   Lab Results  Component Value Date   HDL 39 (L) 01/14/2019   Lab Results  Component Value Date   LDLCALC 138 (H) 01/14/2019   Lab Results  Component Value Date   TRIG 212 (H) 01/14/2019   Lab Results  Component Value Date   CHOLHDL 5.4 (H) 01/14/2019   Lab Results  Component Value Date   HGBA1C 5.3 10/29/2019      Assessment & Plan:   Problem List Items Addressed This Visit      Other   BMI 29.0-29.9,adult    Other Visit Diagnoses    Abnormal weight gain    -  Primary      BMI 29/abnormal weight gain-she has been working on weight loss for about 4 months now with trying to increase her activity level and working on diet and portion control and really just has not been able to make a lot of headway.  More recently her exercise has fallen off.  She did try  the Metformin and had significant side effects for greater than a month so discontinued it.  We discussed options  including going to a bariatric clinic where she can get a more comprehensive care plan, versus weight loss medications in addition to diet and exercise.  We discussed the options available on the market currently.  We will start with we will go V.  Did explain how the medication works and potential for side effects.  She can call if she has any problems recommend she print the coupon off online to bring it down to a reasonable cost especially if she is not sure if her insurance will cover this.  We discussed setting up a goal and working toward that and especially with working on portion control and try to get back into some type of exercise routine.  Her biggest hope is to really just get more healthy and prevent diabetes she has a very strong family history of diabetes and is afraid that that is the past she will go down if she continues to carry extra weight.  Current weight: 170 pounds Previous weight: Change in weight: Starting weight: 170 pounds Goal weight: 135-40 pounds Dietary goals: Continue to work on portion control she feels like she is eating a good variety of healthy foods. Exercise goal: Start with 10 to 15 minutes 3 days a week Medication: We will start we will give the 0.25 mg weekly.  Recommend print coupon card off. Follow-up: 4 weeks   Meds ordered this encounter  Medications  . Semaglutide-Weight Management (WEGOVY) 0.25 MG/0.5ML SOAJ    Sig: Inject 0.25 mg into the skin every 7 (seven) days.    Dispense:  2 mL    Refill:  0    Follow-up: Return in about 4 weeks (around 03/01/2020) for weight loss.    Beatrice Lecher, MD

## 2020-02-05 MED ORDER — SAXENDA 18 MG/3ML ~~LOC~~ SOPN: PEN_INJECTOR | SUBCUTANEOUS | 0 refills | Status: DC

## 2020-03-01 ENCOUNTER — Ambulatory Visit (INDEPENDENT_AMBULATORY_CARE_PROVIDER_SITE_OTHER): Payer: 59 | Admitting: Family Medicine

## 2020-03-01 ENCOUNTER — Other Ambulatory Visit: Payer: Self-pay

## 2020-03-01 ENCOUNTER — Encounter: Payer: Self-pay | Admitting: Family Medicine

## 2020-03-01 VITALS — BP 123/74 | HR 85 | Ht 64.0 in | Wt 168.0 lb

## 2020-03-01 DIAGNOSIS — Z6829 Body mass index (BMI) 29.0-29.9, adult: Secondary | ICD-10-CM | POA: Diagnosis not present

## 2020-03-01 DIAGNOSIS — R635 Abnormal weight gain: Secondary | ICD-10-CM | POA: Diagnosis not present

## 2020-03-01 DIAGNOSIS — Z7689 Persons encountering health services in other specified circumstances: Secondary | ICD-10-CM | POA: Diagnosis not present

## 2020-03-01 MED ORDER — PHENTERMINE HCL 15 MG PO CAPS: 15.0000 mg | ORAL_CAPSULE | ORAL | 0 refills | Status: DC

## 2020-03-01 MED ORDER — SAXENDA 18 MG/3ML ~~LOC~~ SOPN: PEN_INJECTOR | SUBCUTANEOUS | 0 refills | Status: AC

## 2020-03-01 NOTE — Progress Notes (Signed)
Established Patient Office Visit  Subjective:  Patient ID: Breanna Mcmillan, female    DOB: 12/09/1985  Age: 35 y.o. MRN: [id number]  CC: No chief complaint on file.   HPI Breanna Mcmillan presents for f/u weight loss.  She was unable to get the [place] or the [place] covered with her insurance will be able to use the coupon.  She just that she just got frustrated.  She did start going gluten-free about 3 weeks ago.  She had spoken with a friend who said that a lot of the joint pain improved when they went gluten-free.  She is down 2 lbs from when I last saw her 4 weeks ago.  She still exercising regularly.  Hx of PCOS  Meds tried: metformin    Past Medical History:  Diagnosis Date  . Ectopic pregnancy     No past surgical history on file.  Family History  Problem Relation Age of Onset  . Depression Mother   . Hypertension Father   . Diabetes Paternal Grandfather     Social History   Socioeconomic History  . Marital status: Married    Spouse name: Breanna Mcmillan  . Number of children: Not on file  . Years of education: Not on file  . Highest education level: Not on file  Occupational History  . Occupation: Glass blower/designer.    Employer: [employer]  Tobacco Use  . Smoking status: Never Smoker  . Smokeless tobacco: Never Used  Substance and Sexual Activity  . Alcohol use: Yes    Comment: rare  . Drug use: Not on file  . Sexual activity: Yes    Partners: Male  Other Topics Concern  . Not on file  Social History Narrative   Father with IMGUS (possible multiple myeloma).  Occ exercise.  1 caffeine drink per day.    Social Determinants of Health   Financial Resource Strain: Not on file  Food Insecurity: Not on file  Transportation Needs: Not on file  Physical Activity: Not on file  Stress: Not on file  Social Connections: Not on file  Intimate Partner Violence: Not on file    Outpatient Medications Prior to Visit  Medication Sig Dispense Refill  . albuterol  (PROAIR HFA) 108 (90 Base) MCG/ACT inhaler Inhale 1-2 puffs into the lungs every 6 (six) hours as needed for wheezing or shortness of breath. 18 g 1  . norethindrone-ethinyl estradiol-iron (HAILEY FE 1.5/30) 1.5-30 MG-MCG tablet Take 1 tablet by mouth daily. 84 tablet 4  . triamcinolone cream (KENALOG) 0.1 % Apply 1 application topically 2 (two) times daily. 30 g 0  . Liraglutide -Weight Management (SAXENDA) 18 MG/3ML SOPN Inject 0.6 mg into the skin daily for 7 days, THEN 1.2 mg daily for 7 days, THEN 1.8 mg daily for 7 days, THEN 2.4 mg daily for 7 days. 9 mL 0  . Semaglutide-Weight Management (WEGOVY) 0.25 MG/0.5ML SOAJ Inject 0.25 mg into the skin every 7 (seven) days. 2 mL 0   No facility-administered medications prior to visit.    Allergies  Allergen Reactions  . Hydrocodone-Acetaminophen Itching and Nausea And Vomiting    Other; causes migraines    . Codeine   . Glucophage Xr [Metformin Hcl Er] Diarrhea    ROS Review of Systems    Objective:    Physical Exam Constitutional:      Appearance: She is well-developed and well-nourished.  HENT:     Head: Normocephalic and atraumatic.  Cardiovascular:     Rate  and Rhythm: Normal rate and regular rhythm.     Heart sounds: Normal heart sounds.  Pulmonary:     Effort: Pulmonary effort is normal.     Breath sounds: Normal breath sounds.  Skin:    General: Skin is warm and dry.  Neurological:     Mental Status: She is alert and oriented to person, place, and time.  Psychiatric:        Mood and Affect: Mood and affect normal.        Behavior: Behavior normal.     BP 123/74   Pulse 85   Ht '5\' 4"'  (1.626 m)   Wt 168 lb (76.2 kg)   SpO2 99%   BMI 28.84 kg/m  Wt Readings from Last 3 Encounters:  03/01/20 168 lb (76.2 kg)  02/02/20 170 lb (77.1 kg)  01/20/20 168 lb 6.4 oz (76.4 kg)     Health Maintenance Due  Topic Date Due  . Hepatitis C Screening  Never done  . HIV Screening  Never done  . COVID-19 Vaccine (3 -  Booster for Pfizer series) 10/22/2019    There are no preventive care reminders to display for this patient.  Lab Results  Component Value Date   TSH 2.57 10/29/2019   Lab Results  Component Value Date   WBC 6.7 01/20/2020   HGB 13.5 01/20/2020   HCT 40.1 01/20/2020   MCV 79.1 (L) 01/20/2020   PLT 357 01/20/2020   Lab Results  Component Value Date   NA 139 01/20/2020   K 3.9 01/20/2020   CO2 28 01/20/2020   GLUCOSE 96 01/20/2020   BUN 9 01/20/2020   CREATININE 0.75 01/20/2020   BILITOT 0.2 01/20/2020   ALKPHOS 41 06/07/2015   AST 9 (L) 01/20/2020   ALT 9 01/20/2020   PROT 6.8 01/20/2020   ALBUMIN 4.3 06/07/2015   CALCIUM 9.2 01/20/2020   Lab Results  Component Value Date   CHOL 212 (H) 01/14/2019   Lab Results  Component Value Date   HDL 39 (L) 01/14/2019   Lab Results  Component Value Date   LDLCALC 138 (H) 01/14/2019   Lab Results  Component Value Date   TRIG 212 (H) 01/14/2019   Lab Results  Component Value Date   CHOLHDL 5.4 (H) 01/14/2019   Lab Results  Component Value Date   HGBA1C 5.3 10/29/2019      Assessment & Plan:   Problem List Items Addressed This Visit      Other   BMI 29.0-29.9,adult   Relevant Medications   Liraglutide -Weight Management (SAXENDA) 18 MG/3ML SOPN    Other Visit Diagnoses    Abnormal weight gain    -  Primary   Relevant Medications   Liraglutide -Weight Management (SAXENDA) 18 MG/3ML SOPN   Encounter for weight management         Encounter for weight gain-discussed options.  Will print the prescription so that she can take it to the pharmacy recommend she try [facility] as a have had some patient successfully fill the drug there or even somewhere like Breanna Mcmillan that might be able to help her.  If they still will not cover the [facility], then recommend starting phentermine we discussed pros and cons of medication and potential side effects.  Stop immediately if any chest pain shortness of breath or palpitations.   Do not mix with caffeinated foods or beverages.  Continue with regular exercise.  Current weight: 168 pounds Previous weight: 170 lbs Change in weight:  down 2 lbs.  Starting weight: 170 pounds Goal weight: 135-40 pounds Dietary goals: Continue to work on portion control she feels like she is eating a good variety of healthy foods. Exercise goal: Start with 10 to 15 minutes 3 days a week Medication: start phentermine 34m.   Follow-up: 4 weeks  Meds ordered this encounter  Medications  . Liraglutide -Weight Management (SAXENDA) 18 MG/3ML SOPN    Sig: Inject 0.6 mg into the skin daily for 7 days, THEN 1.2 mg daily for 7 days, THEN 1.8 mg daily for 7 days, THEN 2.4 mg daily for 7 days.    Dispense:  9 mL    Refill:  0  . phentermine 15 MG capsule    Sig: Take 1 capsule (15 mg total) by mouth every morning.    Dispense:  30 capsule    Refill:  0    Follow-up: Return in about 4 weeks (around 03/29/2020) for New start medication.    CBeatrice Lecher MD

## 2020-03-29 ENCOUNTER — Ambulatory Visit: Payer: 59 | Admitting: Family Medicine

## 2020-04-29 ENCOUNTER — Encounter: Payer: Self-pay | Admitting: Family Medicine

## 2020-04-29 ENCOUNTER — Other Ambulatory Visit: Payer: Self-pay

## 2020-04-29 ENCOUNTER — Ambulatory Visit (INDEPENDENT_AMBULATORY_CARE_PROVIDER_SITE_OTHER): Payer: 59 | Admitting: Family Medicine

## 2020-04-29 VITALS — BP 118/78 | HR 92 | Ht 64.0 in | Wt 160.0 lb

## 2020-04-29 DIAGNOSIS — E282 Polycystic ovarian syndrome: Secondary | ICD-10-CM | POA: Diagnosis not present

## 2020-04-29 DIAGNOSIS — R635 Abnormal weight gain: Secondary | ICD-10-CM | POA: Diagnosis not present

## 2020-04-29 DIAGNOSIS — Z7689 Persons encountering health services in other specified circumstances: Secondary | ICD-10-CM | POA: Insufficient documentation

## 2020-04-29 LAB — POCT GLYCOSYLATED HEMOGLOBIN (HGB A1C): Hemoglobin A1C: 5.2 % (ref 4.0–5.6)

## 2020-04-29 MED ORDER — PHENTERMINE HCL 15 MG PO CAPS: 15.0000 mg | ORAL_CAPSULE | ORAL | 0 refills | Status: DC

## 2020-04-29 NOTE — Addendum Note (Signed)
Addended by: Teddy Spike on: 04/29/2020 04:43 PM   Modules accepted: Orders

## 2020-04-29 NOTE — Progress Notes (Signed)
Patient doing well on appetite suppressant. weight, BP, HR check. Denies problems with insomnia, heart palpitations or tremors. She is pleased with her weight loss thus far and continues to work on healthy diet and regular exercise.

## 2020-04-29 NOTE — Progress Notes (Signed)
Established Patient Office Visit  Subjective:  Patient ID: Breanna Mcmillan, female    DOB: 06-28-1985  Age: 35 y.o. MRN: [id number]  CC:  Chief Complaint  Patient presents with  . Weight Check    HPI Breanna Mcmillan presents for follow-up for encounter for weight management.  4-week follow-up.  Patient doing well on appetite suppressant. weight, BP, HR check. Denies problems with insomnia, heart palpitations or tremors. She is pleased with her weight loss thus far and continues to work on healthy diet and regular exercise.   Past Medical History:  Diagnosis Date  . Ectopic pregnancy     No past surgical history on file.  Family History  Problem Relation Age of Onset  . Depression Mother   . Hypertension Father   . Diabetes Paternal Grandfather     Social History   Socioeconomic History  . Marital status: Married    Spouse name: micheal  . Number of children: Not on file  . Years of education: Not on file  . Highest education level: Not on file  Occupational History  . Occupation: Glass blower/designer.    Employer: [employer]  Tobacco Use  . Smoking status: Never Smoker  . Smokeless tobacco: Never Used  Substance and Sexual Activity  . Alcohol use: Yes    Comment: rare  . Drug use: Not on file  . Sexual activity: Yes    Partners: Male  Other Topics Concern  . Not on file  Social History Narrative   Father with IMGUS (possible multiple myeloma).  Occ exercise.  1 caffeine drink per day.    Social Determinants of Health   Financial Resource Strain: Not on file  Food Insecurity: Not on file  Transportation Needs: Not on file  Physical Activity: Not on file  Stress: Not on file  Social Connections: Not on file  Intimate Partner Violence: Not on file    Outpatient Medications Prior to Visit  Medication Sig Dispense Refill  . albuterol (PROAIR HFA) 108 (90 Base) MCG/ACT inhaler Inhale 1-2 puffs into the lungs every 6 (six) hours as needed for wheezing  or shortness of breath. 18 g 1  . norethindrone-ethinyl estradiol-iron (HAILEY FE 1.5/30) 1.5-30 MG-MCG tablet Take 1 tablet by mouth daily. 84 tablet 4  . triamcinolone cream (KENALOG) 0.1 % Apply 1 application topically 2 (two) times daily. 30 g 0  . phentermine 15 MG capsule Take 1 capsule (15 mg total) by mouth every morning. 30 capsule 0   No facility-administered medications prior to visit.    Allergies  Allergen Reactions  . Hydrocodone-Acetaminophen Itching and Nausea And Vomiting    Other; causes migraines    . Codeine   . Glucophage Xr [Metformin Hcl Er] Diarrhea    ROS Review of Systems    Objective:    Physical Exam Constitutional:      Appearance: She is well-developed.  HENT:     Head: Normocephalic and atraumatic.  Cardiovascular:     Rate and Rhythm: Normal rate and regular rhythm.     Heart sounds: Normal heart sounds.  Pulmonary:     Effort: Pulmonary effort is normal.     Breath sounds: Normal breath sounds.  Skin:    General: Skin is warm and dry.  Neurological:     Mental Status: She is alert and oriented to person, place, and time.  Psychiatric:        Behavior: Behavior normal.     BP 118/78   Pulse  92   Ht '5\' 4"'  (1.626 m)   Wt 160 lb (72.6 kg)   SpO2 100%   BMI 27.46 kg/m  Wt Readings from Last 3 Encounters:  04/29/20 160 lb (72.6 kg)  03/01/20 168 lb (76.2 kg)  02/02/20 170 lb (77.1 kg)     Health Maintenance Due  Topic Date Due  . Hepatitis C Screening  Never done  . HIV Screening  Never done  . COVID-19 Vaccine (3 - Booster for Pfizer series) 10/22/2019    There are no preventive care reminders to display for this patient.  Lab Results  Component Value Date   TSH 2.57 10/29/2019   Lab Results  Component Value Date   WBC 6.7 01/20/2020   HGB 13.5 01/20/2020   HCT 40.1 01/20/2020   MCV 79.1 (L) 01/20/2020   PLT 357 01/20/2020   Lab Results  Component Value Date   NA 139 01/20/2020   K 3.9 01/20/2020   CO2 28  01/20/2020   GLUCOSE 96 01/20/2020   BUN 9 01/20/2020   CREATININE 0.75 01/20/2020   BILITOT 0.2 01/20/2020   ALKPHOS 41 06/07/2015   AST 9 (L) 01/20/2020   ALT 9 01/20/2020   PROT 6.8 01/20/2020   ALBUMIN 4.3 06/07/2015   CALCIUM 9.2 01/20/2020   Lab Results  Component Value Date   CHOL 212 (H) 01/14/2019   Lab Results  Component Value Date   HDL 39 (L) 01/14/2019   Lab Results  Component Value Date   LDLCALC 138 (H) 01/14/2019   Lab Results  Component Value Date   TRIG 212 (H) 01/14/2019   Lab Results  Component Value Date   CHOLHDL 5.4 (H) 01/14/2019   Lab Results  Component Value Date   HGBA1C 5.3 10/29/2019      Assessment & Plan:   Problem List Items Addressed This Visit      Other   Encounter for weight management    Current weight: 160 lbs Previous weight: 168 lbs Change in weight: down 8 lbs.  Starting weight:170 pounds Goal weight:135-40 pounds Dietary goals: Wants to work on cutting down on dairy to reduce fat content in her diet.  Exercise goal:Start with 10 to 15 minutes 3 days a week Medication:start phentermine 51m   Follow-up:4 weeks       Other Visit Diagnoses    Abnormal weight gain    -  Primary      Meds ordered this encounter  Medications  . phentermine 15 MG capsule    Sig: Take 1 capsule (15 mg total) by mouth every morning.    Dispense:  30 capsule    Refill:  0    Follow-up: Return in about 4 weeks (around 05/27/2020) for nurse visit for weight check .    CBeatrice Lecher MD

## 2020-04-29 NOTE — Assessment & Plan Note (Addendum)
Current weight: 160 lbs Previous weight: 168 lbs Change in weight: down 8 lbs.  Starting weight:170 pounds Goal weight:135-40 pounds Dietary goals: Wants to work on cutting down on dairy to reduce fat content in her diet.  Exercise goal:Start with 10 to 15 minutes 3 days a week Medication:start phentermine 15mg    Follow-up:4 weeks

## 2020-05-24 ENCOUNTER — Telehealth: Payer: Self-pay | Admitting: Family Medicine

## 2020-05-24 MED ORDER — PHENTERMINE HCL 15 MG PO CAPS: 15.0000 mg | ORAL_CAPSULE | ORAL | 0 refills | Status: DC

## 2020-05-24 NOTE — Telephone Encounter (Signed)
Med sent. Ok to schedule f/u in a couple fo weeks.

## 2020-05-24 NOTE — Telephone Encounter (Signed)
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 We rescheduled her but she will run out of medicine before her appointment can you please send her a refill in.   Thank you Jenny Reichmann

## 2020-05-25 NOTE — Telephone Encounter (Signed)
LVM advising pt to schedule a f/u in a couple of weeks

## 2020-05-26 ENCOUNTER — Ambulatory Visit: Payer: 59

## 2020-06-14 ENCOUNTER — Ambulatory Visit (INDEPENDENT_AMBULATORY_CARE_PROVIDER_SITE_OTHER): Payer: 59 | Admitting: Family Medicine

## 2020-06-14 ENCOUNTER — Other Ambulatory Visit: Payer: Self-pay

## 2020-06-14 VITALS — BP 130/80 | HR 89 | Temp 98.2°F | Resp 20 | Ht 64.0 in | Wt 155.7 lb

## 2020-06-14 DIAGNOSIS — Z7689 Persons encountering health services in other specified circumstances: Secondary | ICD-10-CM

## 2020-06-14 DIAGNOSIS — R635 Abnormal weight gain: Secondary | ICD-10-CM | POA: Diagnosis not present

## 2020-06-14 MED ORDER — PHENTERMINE HCL 15 MG PO CAPS: 15.0000 mg | ORAL_CAPSULE | ORAL | 0 refills | Status: DC

## 2020-06-14 NOTE — Progress Notes (Signed)
Wt mgt - she has lost 5 lbs. RF medication   Meds ordered this encounter  Medications  . phentermine 15 MG capsule    Sig: Take 1 capsule (15 mg total) by mouth every morning.    Dispense:  30 capsule    Refill:  0

## 2020-06-14 NOTE — Progress Notes (Signed)
Established Patient Office Visit  Subjective:  Patient ID: Breanna Mcmillan, female    DOB: 11/10/1985  Age: 35 y.o. MRN: [id number]  CC:  Chief Complaint  Patient presents with  . Weight Check    HPI Breanna Mcmillan presents for a weight check. Pt has lost weight since last visit. Weight today is 155.7lbs. BP 130/80. Pt reports taking medication as prescribed. Refill pended, pt will return in 4 weeks for a weight check.  Past Medical History:  Diagnosis Date  . Ectopic pregnancy     No past surgical history on file.  Family History  Problem Relation Age of Onset  . Depression Mother   . Hypertension Father   . Diabetes Paternal Grandfather     Social History   Socioeconomic History  . Marital status: Married    Spouse name: micheal  . Number of children: Not on file  . Years of education: Not on file  . Highest education level: Not on file  Occupational History  . Occupation: Glass blower/designer.    Employer: [employer]  Tobacco Use  . Smoking status: Never Smoker  . Smokeless tobacco: Never Used  Substance and Sexual Activity  . Alcohol use: Yes    Comment: rare  . Drug use: Not on file  . Sexual activity: Yes    Partners: Male  Other Topics Concern  . Not on file  Social History Narrative   Father with IMGUS (possible multiple myeloma).  Occ exercise.  1 caffeine drink per day.    Social Determinants of Health   Financial Resource Strain: Not on file  Food Insecurity: Not on file  Transportation Needs: Not on file  Physical Activity: Not on file  Stress: Not on file  Social Connections: Not on file  Intimate Partner Violence: Not on file    Outpatient Medications Prior to Visit  Medication Sig Dispense Refill  . albuterol (PROAIR HFA) 108 (90 Base) MCG/ACT inhaler Inhale 1-2 puffs into the lungs every 6 (six) hours as needed for wheezing or shortness of breath. 18 g 1  . norethindrone-ethinyl estradiol-iron (HAILEY FE 1.5/30) 1.5-30 MG-MCG  tablet Take 1 tablet by mouth daily. 84 tablet 4  . phentermine 15 MG capsule Take 1 capsule (15 mg total) by mouth every morning. 30 capsule 0  . triamcinolone cream (KENALOG) 0.1 % Apply 1 application topically 2 (two) times daily. 30 g 0   No facility-administered medications prior to visit.    Allergies  Allergen Reactions  . Hydrocodone-Acetaminophen Itching and Nausea And Vomiting    Other; causes migraines    . Codeine   . Glucophage Xr [Metformin Hcl Er] Diarrhea    ROS Review of Systems    Objective:    Physical Exam  BP 130/80 (BP Location: Left Arm, Patient Position: Sitting, Cuff Size: Normal)   Pulse 89   Temp 98.2 F (36.8 C) (Oral)   Resp 20   Ht _0  (1.626 m)   Wt 155 lb 11.2 oz (70.6 kg)   SpO2 100%   BMI 26.73 kg/m  Wt Readings from Last 3 Encounters:  06/14/20 155 lb 11.2 oz (70.6 kg)  04/29/20 160 lb (72.6 kg)  03/01/20 168 lb (76.2 kg)     Health Maintenance Due  Topic Date Due  . Pneumococcal Vaccine 25-70 Years old (1 of 2 - PPSV23) Never done  . HIV Screening  Never done  . Hepatitis C Screening  Never done  . COVID-19 Vaccine (3 -  Booster for [employer] series) 09/22/2019    There are no preventive care reminders to display for this patient.  Lab Results  Component Value Date   TSH 2.57 10/29/2019   Lab Results  Component Value Date   WBC 6.7 01/20/2020   HGB 13.5 01/20/2020   HCT 40.1 01/20/2020   MCV 79.1 (L) 01/20/2020   PLT 357 01/20/2020   Lab Results  Component Value Date   NA 139 01/20/2020   K 3.9 01/20/2020   CO2 28 01/20/2020   GLUCOSE 96 01/20/2020   BUN 9 01/20/2020   CREATININE 0.75 01/20/2020   BILITOT 0.2 01/20/2020   ALKPHOS 41 06/07/2015   AST 9 (L) 01/20/2020   ALT 9 01/20/2020   PROT 6.8 01/20/2020   ALBUMIN 4.3 06/07/2015   CALCIUM 9.2 01/20/2020   Lab Results  Component Value Date   CHOL 212 (H) 01/14/2019   Lab Results  Component Value Date   HDL 39 (L) 01/14/2019   Lab Results   Component Value Date   LDLCALC 138 (H) 01/14/2019   Lab Results  Component Value Date   TRIG 212 (H) 01/14/2019   Lab Results  Component Value Date   CHOLHDL 5.4 (H) 01/14/2019   Lab Results  Component Value Date   HGBA1C 5.2 04/29/2020      Assessment & Plan:  Pt has lost weight since last visit. Weight today is 155.7lbs. Pt reports taking medication as prescribed. Refill pended, pt will return in 4 weeks for a weight check. Problem List Items Addressed This Visit      Other   Encounter for weight management - Primary    Other Visit Diagnoses    Abnormal weight gain          No orders of the defined types were placed in this encounter.   Follow-up: Return in about 4 weeks (around 07/12/2020) for Weight Check.    Ninfa Meeker, CMA

## 2020-06-14 NOTE — Progress Notes (Signed)
Pt has lost weight since last visit. Weight today is 155.7lbs. BP 130/80. Pt reports taking medication as prescribed. Refill pended, pt will return in 4 weeks for a weight check.

## 2020-07-09 ENCOUNTER — Emergency Department: Admit: 2020-07-09 | Discharge status: Against Medical Advice | Payer: Self-pay

## 2020-07-09 ENCOUNTER — Emergency Department
Admission: EM | Admit: 2020-07-09 | Discharge: 2020-07-09 | Disposition: A | Discharge status: Home / Self Care | Payer: 59 | Source: Home / Self Care

## 2020-07-09 ENCOUNTER — Other Ambulatory Visit: Payer: Self-pay

## 2020-07-09 DIAGNOSIS — J0100 Acute maxillary sinusitis, unspecified: Secondary | ICD-10-CM | POA: Diagnosis not present

## 2020-07-09 HISTORY — DX: Other seasonal allergic rhinitis: J30.2

## 2020-07-09 MED ORDER — AMOXICILLIN-POT CLAVULANATE 875-125 MG PO TABS: 1.0000 | ORAL_TABLET | Freq: Two times a day (BID) | ORAL | 0 refills | Status: DC

## 2020-07-09 MED ORDER — PREDNISONE 20 MG PO TABS: 20.0000 mg | ORAL_TABLET | Freq: Two times a day (BID) | ORAL | 0 refills | Status: DC

## 2020-07-09 NOTE — ED Triage Notes (Addendum)
Pt presents to Urgent Care with c/o nasal congestion (w/ greenish mucus), facial pain, and otalgia/ear fullness x 2 days. Reports having a "cold" 2 weeks ago (negative home COVID test) and suspects she has a sinus infection. Also reports cough and scratchy throat which she attributes to "tickle" d/t postnasal drip.

## 2020-07-09 NOTE — ED Provider Notes (Signed)
[name]    CSN: [id number] Arrival date & time: 07/09/20  1524      History   Chief Complaint Chief Complaint  Patient presents with   Nasal Congestion   Otalgia   Facial Pain    HPI Breanna Mcmillan is a 35 y.o. female.   HPI  Patient has seasonal allergies.  She states that she has had repeated sinus infections over the years.  After allergy shots, and with maintenance with antihistamines Flonase and nasal rinses she has had a reduction in the number of infections.  Unfortunately, right now she has been sick for 2 weeks.  The mucus is changed from clear, to thick, to green and bloody.  She has pressure and pain in her cheeks.  She states that the mucus feels very thick and is hard to expectorate.  She has no fever or chills, no sore throat, moderate headache.  Past Medical History:  Diagnosis Date   Ectopic pregnancy    Seasonal allergies     Patient Active Problem List   Diagnosis Date Noted   Encounter for weight management 04/29/2020   BMI 29.0-29.9,adult 10/29/2019   PCOS (polycystic ovarian syndrome) 10/29/2019   Contraception management 10/29/2019   LEG EDEMA 03/14/2010   COUGH, CHRONIC 08/22/2009   OTHER ABNORMAL GLUCOSE 09/03/2008   MIGRAINE W/O AURA W/O INTRACT W/O STAT MIGRNOSUS 08/16/2008   ASTHMA, INTRINSIC 10/14/2007   ALLERGIC RHINITIS 08/07/2006    Past Surgical History:  Procedure Laterality Date   LAPAROSCOPIC ENDOMETRIOSIS FULGURATION      OB History     Gravida  1   Para      Term      Preterm      AB      Living         SAB      IAB      Ectopic      Multiple      Live Births               Home Medications    Prior to Admission medications   Medication Sig Start Date End Date Taking? Authorizing Provider  amoxicillin-clavulanate (AUGMENTIN) 875-125 MG tablet Take 1 tablet by mouth every 12 (twelve) hours. 07/09/20  Yes Raylene Everts, MD  fluticasone [facility]) 50 MCG/ACT nasal spray Place 2  sprays into both nostrils daily.   Yes [provider]  ibuprofen (ADVIL) 400 MG tablet Take 400 mg by mouth every 6 (six) hours as needed.   Yes [provider]  predniSONE (DELTASONE) 20 MG tablet Take 1 tablet (20 mg total) by mouth 2 (two) times daily with a meal. 07/09/20  Yes Raylene Everts, MD  albuterol [facility] HFA) 108 (90 Base) MCG/ACT inhaler Inhale 1-2 puffs into the lungs every 6 (six) hours as needed for wheezing or shortness of breath. 01/13/20   Hali Marry, MD  norethindrone-ethinyl estradiol-iron (HAILEY FE 1.5/30) 1.5-30 MG-MCG tablet Take 1 tablet by mouth daily. 10/29/19   Hali Marry, MD  phentermine 15 MG capsule Take 1 capsule (15 mg total) by mouth every morning. 06/14/20   Hali Marry, MD  triamcinolone cream (KENALOG) 0.1 % Apply 1 application topically 2 (two) times daily. 07/27/19   Noe Gens, PA-C    Family History Family History  Problem Relation Age of Onset   Depression Mother    Hypertension Father    Diabetes Paternal Grandfather     Social History Social History  Tobacco Use   Smoking status: Never   Smokeless tobacco: Never  Vaping Use   Vaping Use: Never used  Substance Use Topics   Alcohol use: Yes    Comment: rare   Drug use: Not Currently     Allergies   Hydrocodone-acetaminophen, Codeine, and Glucophage xr [metformin hcl er]   Review of Systems Review of Systems See HPI  Physical Exam Triage Vital Signs ED Triage Vitals  Enc Vitals Group     BP 07/09/20 1544 (!) 141/83     Pulse Rate 07/09/20 1544 (!) 108     Resp 07/09/20 1544 20     Temp 07/09/20 1544 98.7 F (37.1 C)     Temp src --      SpO2 07/09/20 1544 100 %     Weight 07/09/20 1539 150 lb (68 kg)     Height 07/09/20 1539 5\' 6"  (1.676 m)     Head Circumference --      Peak Flow --      Pain Score 07/09/20 1539 7     Pain Loc --      Pain Edu? --      Excl. in [place]? --    No data found.  Updated Vital  Signs BP (!) 141/83 (BP Location: Right Arm)   Pulse (!) 108   Temp 98.7 F (37.1 C)   Resp 20   Ht 5\' 6"  (1.676 m)   Wt 68 kg   LMP 06/13/2020   SpO2 100%   BMI 24.21 kg/m     Physical Exam Constitutional:      General: She is not in acute distress.    Appearance: Normal appearance. She is well-developed and normal weight.  HENT:     Head: Normocephalic and atraumatic.     Right Ear: Tympanic membrane, ear canal and external ear normal.     Left Ear: Tympanic membrane, ear canal and external ear normal.     Nose: Congestion and rhinorrhea present.     Mouth/Throat:     Mouth: Mucous membranes are moist.     Pharynx: Posterior oropharyngeal erythema present.     Comments: Both maxillary sinuses are tender Eyes:     Conjunctiva/sclera: Conjunctivae normal.     Pupils: Pupils are equal, round, and reactive to light.  Cardiovascular:     Rate and Rhythm: Normal rate.  Pulmonary:     Effort: Pulmonary effort is normal. No respiratory distress.  Abdominal:     General: There is no distension.     Palpations: Abdomen is soft.  Musculoskeletal:        General: Normal range of motion.     Cervical back: Normal range of motion.  Lymphadenopathy:     Cervical: Cervical adenopathy present.  Skin:    General: Skin is warm and dry.  Neurological:     Mental Status: She is alert.  Psychiatric:        Mood and Affect: Mood normal.        Behavior: Behavior normal.     UC Treatments / Results  Labs (all labs ordered are listed, but only abnormal results are displayed) Labs Reviewed - No data to display  EKG   Radiology No results found.  Procedures Procedures (including critical care time)  Medications Ordered in UC Medications - No data to display  Initial Impression / Assessment and Plan / UC Course  I have reviewed the triage vital signs and the nursing notes.  Pertinent labs &  imaging results that were available during my care of the patient were reviewed by  me and considered in my medical decision making (see chart for details).      Final Clinical Impressions(s) / UC Diagnoses   Final diagnoses:  Acute non-recurrent maxillary sinusitis     Discharge Instructions      Continue to push fluids, take Mucinex, and use the nasal sprays Drink lots of fluids Take the Augmentin 2 times a day Take the prednisone 2 times a day Expect improvement over the next 2 to 3 days   ED Prescriptions     Medication Sig Dispense Auth. Provider   amoxicillin-clavulanate (AUGMENTIN) 875-125 MG tablet Take 1 tablet by mouth every 12 (twelve) hours. 14 tablet Raylene Everts, MD   predniSONE (DELTASONE) 20 MG tablet Take 1 tablet (20 mg total) by mouth 2 (two) times daily with a meal. 10 tablet Raylene Everts, MD      PDMP not reviewed this encounter.   Raylene Everts, MD 07/09/20 [phone]

## 2020-07-09 NOTE — Discharge Instructions (Addendum)
Continue to push fluids, take Mucinex, and use the nasal sprays Drink lots of fluids Take the Augmentin 2 times a day Take the prednisone 2 times a day Expect improvement over the next 2 to 3 days

## 2020-07-19 ENCOUNTER — Other Ambulatory Visit: Payer: Self-pay

## 2020-07-19 ENCOUNTER — Ambulatory Visit (INDEPENDENT_AMBULATORY_CARE_PROVIDER_SITE_OTHER): Payer: 59 | Admitting: Family Medicine

## 2020-07-19 VITALS — BP 109/73 | HR 105 | Resp 20 | Wt 157.0 lb

## 2020-07-19 DIAGNOSIS — Z7689 Persons encountering health services in other specified circumstances: Secondary | ICD-10-CM | POA: Diagnosis not present

## 2020-07-19 DIAGNOSIS — R635 Abnormal weight gain: Secondary | ICD-10-CM

## 2020-07-19 MED ORDER — PHENTERMINE HCL 15 MG PO CAPS: 15.0000 mg | ORAL_CAPSULE | ORAL | 0 refills | Status: AC

## 2020-07-19 NOTE — Progress Notes (Signed)
Established Patient Office Visit  Subjective:  Patient ID: Breanna Mcmillan, female    DOB: [birth year]  Age: 35 y.o. MRN: [id number]  CC:  Chief Complaint  Patient presents with   Weight Check    HPI Breanna Mcmillan presents for a weight check. Pt taking medications as prescribed. Pt has not lost weight since last visit. Weight today is 157lbs.  Past Medical History:  Diagnosis Date   Ectopic pregnancy    Seasonal allergies     Past Surgical History:  Procedure Laterality Date   LAPAROSCOPIC ENDOMETRIOSIS FULGURATION      Family History  Problem Relation Age of Onset   Depression Mother    Hypertension Father    Diabetes Paternal Grandfather     Social History   Socioeconomic History   Marital status: Married    Spouse name: Breanna Mcmillan   Number of children: Not on file   Years of education: Not on file   Highest education level: Not on file  Occupational History   Occupation: Glass blower/designer.    Employer: [employer]  Tobacco Use   Smoking status: Never   Smokeless tobacco: Never  Vaping Use   Vaping Use: Never used  Substance and Sexual Activity   Alcohol use: Yes    Comment: rare   Drug use: Not Currently   Sexual activity: Not on file  Other Topics Concern   Not on file  Social History Narrative   Father with IMGUS (possible multiple myeloma).  Occ exercise.  1 caffeine drink per day.    Social Determinants of Health   Financial Resource Strain: Not on file  Food Insecurity: Not on file  Transportation Needs: Not on file  Physical Activity: Not on file  Stress: Not on file  Social Connections: Not on file  Intimate Partner Violence: Not on file    Outpatient Medications Prior to Visit  Medication Sig Dispense Refill   albuterol (PROAIR HFA) 108 (90 Base) MCG/ACT inhaler Inhale 1-2 puffs into the lungs every 6 (six) hours as needed for wheezing or shortness of breath. 18 g 1   fluticasone (FLONASE) 50 MCG/ACT nasal spray Place 2 sprays  into both nostrils daily.     norethindrone-ethinyl estradiol-iron (HAILEY FE 1.5/30) 1.5-30 MG-MCG tablet Take 1 tablet by mouth daily. 84 tablet 4   phentermine 15 MG capsule Take 1 capsule (15 mg total) by mouth every morning. 30 capsule 0   triamcinolone cream (KENALOG) 0.1 % Apply 1 application topically 2 (two) times daily. 30 g 0   amoxicillin-clavulanate (AUGMENTIN) 875-125 MG tablet Take 1 tablet by mouth every 12 (twelve) hours. 14 tablet 0   ibuprofen (ADVIL) 400 MG tablet Take 400 mg by mouth every 6 (six) hours as needed.     predniSONE (DELTASONE) 20 MG tablet Take 1 tablet (20 mg total) by mouth 2 (two) times daily with a meal. 10 tablet 0   No facility-administered medications prior to visit.    Allergies  Allergen Reactions   Hydrocodone-Acetaminophen Itching and Nausea And Vomiting    Other; causes migraines     Codeine    Glucophage Xr [Metformin Hcl Er] Diarrhea    ROS Review of Systems    Objective:    Physical Exam  BP 109/73 (BP Location: Left Arm, Patient Position: Sitting, Cuff Size: Normal)   Pulse (!) 105   Resp 20   Wt 157 lb (71.2 kg)   SpO2 98%   BMI 25.34 kg/m  Wt Readings from  Last 3 Encounters:  07/19/20 157 lb (71.2 kg)  07/09/20 150 lb (68 kg)  06/14/20 155 lb 11.2 oz (70.6 kg)     Health Maintenance Due  Topic Date Due   HIV Screening  Never done   Hepatitis C Screening  Never done   COVID-19 Vaccine (3 - Booster for [employer] series) 09/22/2019    There are no preventive care reminders to display for this patient.  Lab Results  Component Value Date   TSH 2.57 10/29/2019   Lab Results  Component Value Date   WBC 6.7 01/20/2020   HGB 13.5 01/20/2020   HCT 40.1 01/20/2020   MCV 79.1 (L) 01/20/2020   PLT 357 01/20/2020   Lab Results  Component Value Date   NA 139 01/20/2020   K 3.9 01/20/2020   CO2 28 01/20/2020   GLUCOSE 96 01/20/2020   BUN 9 01/20/2020   CREATININE 0.75 01/20/2020   BILITOT 0.2 01/20/2020    ALKPHOS 41 06/07/2015   AST 9 (L) 01/20/2020   ALT 9 01/20/2020   PROT 6.8 01/20/2020   ALBUMIN 4.3 06/07/2015   CALCIUM 9.2 01/20/2020   Lab Results  Component Value Date   CHOL 212 (H) 01/14/2019   Lab Results  Component Value Date   HDL 39 (L) 01/14/2019   Lab Results  Component Value Date   LDLCALC 138 (H) 01/14/2019   Lab Results  Component Value Date   TRIG 212 (H) 01/14/2019   Lab Results  Component Value Date   CHOLHDL 5.4 (H) 01/14/2019   Lab Results  Component Value Date   HGBA1C 5.2 04/29/2020      Assessment & Plan:   Problem List Items Addressed This Visit       Other   Encounter for weight management - Primary    No orders of the defined types were placed in this encounter.   Follow-up: Return in about 4 weeks (around 08/16/2020) for Weight Check.    Breanna Mcmillan, CMA

## 2020-07-19 NOTE — Progress Notes (Signed)
Pt taking medications as prescribed. Pt has not lost weight since last visit. Weight today is 157lbs.

## 2020-07-19 NOTE — Progress Notes (Signed)
Pt has been notified and will make appointment.

## 2020-07-19 NOTE — Progress Notes (Signed)
Abnormal weight gain -I will go ahead and refill the phentermine but she needs to lose at least 2 pounds in the next month.  Not sure if there is something going on that she feels is contributing to the weight gain.  I am happy to see her in person at the next visit so that we can discuss any concerns.  Beatrice Lecher, MD

## 2020-07-26 ENCOUNTER — Other Ambulatory Visit: Payer: Self-pay

## 2020-07-26 ENCOUNTER — Ambulatory Visit (INDEPENDENT_AMBULATORY_CARE_PROVIDER_SITE_OTHER): Payer: 59 | Admitting: Family Medicine

## 2020-07-26 ENCOUNTER — Encounter: Payer: Self-pay | Admitting: Family Medicine

## 2020-07-26 VITALS — BP 117/87 | HR 110 | Ht 64.0 in | Wt 155.0 lb

## 2020-07-26 DIAGNOSIS — H8112 Benign paroxysmal vertigo, left ear: Secondary | ICD-10-CM

## 2020-07-26 NOTE — Progress Notes (Signed)
Acute Office Visit  Subjective:    Patient ID: Breanna Mcmillan, female    DOB: 11/02/1985, 35 y.o.   MRN: [id number]  Chief Complaint  Patient presents with   Dizziness    HPI Patient is in today for vertigo x 4 weeks. Thought was her sinuses at first,, because she was having some congestion and pressure but she says eventually her sinuses got better but the vertigo did not its occurring daily at least 2-3 times a day she says the first time that it started it was actually at night she was sleeping in bed and actually rolled over onto her left side and felt like she was going to spin off of her bed.  She denies any ear pain or pressure.  No current upper respiratory symptoms.  Past Medical History:  Diagnosis Date   Ectopic pregnancy    Seasonal allergies     Past Surgical History:  Procedure Laterality Date   LAPAROSCOPIC ENDOMETRIOSIS FULGURATION      Family History  Problem Relation Age of Onset   Depression Mother    Hypertension Father    Diabetes Paternal Grandfather     Social History   Socioeconomic History   Marital status: Married    Spouse name: micheal   Number of children: Not on file   Years of education: Not on file   Highest education level: Not on file  Occupational History   Occupation: Glass blower/designer.    Employer: [employer]  Tobacco Use   Smoking status: Never   Smokeless tobacco: Never  Vaping Use   Vaping Use: Never used  Substance and Sexual Activity   Alcohol use: Yes    Comment: rare   Drug use: Not Currently   Sexual activity: Not on file  Other Topics Concern   Not on file  Social History Narrative   Father with IMGUS (possible multiple myeloma).  Occ exercise.  1 caffeine drink per day.    Social Determinants of Health   Financial Resource Strain: Not on file  Food Insecurity: Not on file  Transportation Needs: Not on file  Physical Activity: Not on file  Stress: Not on file  Social Connections: Not on file   Intimate Partner Violence: Not on file    Outpatient Medications Prior to Visit  Medication Sig Dispense Refill   albuterol (PROAIR HFA) 108 (90 Base) MCG/ACT inhaler Inhale 1-2 puffs into the lungs every 6 (six) hours as needed for wheezing or shortness of breath. 18 g 1   fluticasone (FLONASE) 50 MCG/ACT nasal spray Place 2 sprays into both nostrils daily.     norethindrone-ethinyl estradiol-iron (HAILEY FE 1.5/30) 1.5-30 MG-MCG tablet Take 1 tablet by mouth daily. 84 tablet 4   phentermine 15 MG capsule Take 1 capsule (15 mg total) by mouth every morning. 30 capsule 0   triamcinolone cream (KENALOG) 0.1 % Apply 1 application topically 2 (two) times daily. 30 g 0   No facility-administered medications prior to visit.    Allergies  Allergen Reactions   Hydrocodone-Acetaminophen Itching and Nausea And Vomiting    Other; causes migraines     Codeine    Glucophage Xr [Metformin Hcl Er] Diarrhea    Review of Systems     Objective:    Physical Exam Constitutional:      Appearance: She is well-developed.  HENT:     Head: Normocephalic and atraumatic.     Right Ear: External ear normal.     Left Ear: External  ear normal.     Nose: Nose normal.  Eyes:     Conjunctiva/sclera: Conjunctivae normal.     Pupils: Pupils are equal, round, and reactive to light.  Neck:     Thyroid: No thyromegaly.  Cardiovascular:     Rate and Rhythm: Normal rate and regular rhythm.     Heart sounds: Normal heart sounds.  Pulmonary:     Effort: Pulmonary effort is normal.     Breath sounds: Normal breath sounds. No wheezing.  Musculoskeletal:     Cervical back: Neck supple.     Comments: Positive Dix-Hallpike maneuver to the left.  Lymphadenopathy:     Cervical: No cervical adenopathy.  Skin:    General: Skin is warm and dry.  Neurological:     Mental Status: She is alert and oriented to person, place, and time.    BP 117/87   Pulse (!) 110   Ht '5\' 4"'  (1.626 m)   Wt 155 lb (70.3 kg)    SpO2 100%   BMI 26.61 kg/m  Wt Readings from Last 3 Encounters:  07/26/20 155 lb (70.3 kg)  07/19/20 157 lb (71.2 kg)  07/09/20 150 lb (68 kg)    Health Maintenance Due  Topic Date Due   HIV Screening  Never done   Hepatitis C Screening  Never done   COVID-19 Vaccine (3 - Booster for Pfizer series) 09/22/2019    There are no preventive care reminders to display for this patient.   Lab Results  Component Value Date   TSH 2.57 10/29/2019   Lab Results  Component Value Date   WBC 6.7 01/20/2020   HGB 13.5 01/20/2020   HCT 40.1 01/20/2020   MCV 79.1 (L) 01/20/2020   PLT 357 01/20/2020   Lab Results  Component Value Date   NA 139 01/20/2020   K 3.9 01/20/2020   CO2 28 01/20/2020   GLUCOSE 96 01/20/2020   BUN 9 01/20/2020   CREATININE 0.75 01/20/2020   BILITOT 0.2 01/20/2020   ALKPHOS 41 06/07/2015   AST 9 (L) 01/20/2020   ALT 9 01/20/2020   PROT 6.8 01/20/2020   ALBUMIN 4.3 06/07/2015   CALCIUM 9.2 01/20/2020   Lab Results  Component Value Date   CHOL 212 (H) 01/14/2019   Lab Results  Component Value Date   HDL 39 (L) 01/14/2019   Lab Results  Component Value Date   LDLCALC 138 (H) 01/14/2019   Lab Results  Component Value Date   TRIG 212 (H) 01/14/2019   Lab Results  Component Value Date   CHOLHDL 5.4 (H) 01/14/2019   Lab Results  Component Value Date   HGBA1C 5.2 04/29/2020       Assessment & Plan:   Problem List Items Addressed This Visit   None Visit Diagnoses     BPPV (benign paroxysmal positional vertigo), left    -  Primary   Relevant Orders   Ambulatory referral to Physical Therapy       BPPV, left-discussed diagnosis and discussed treatment.  Given handout on exercises to do on her own at home and reviewed how to do those appropriately we also discussed referral for vestibular rehab especially if she is not improving okay to use meclizine OTC if needed but did warn about potential for side effects.   No orders of the  defined types were placed in this encounter.    Beatrice Lecher, MD

## 2020-08-25 ENCOUNTER — Other Ambulatory Visit: Payer: Self-pay

## 2020-08-25 ENCOUNTER — Encounter: Payer: Self-pay | Admitting: Obstetrics and Gynecology

## 2020-08-25 ENCOUNTER — Ambulatory Visit (INDEPENDENT_AMBULATORY_CARE_PROVIDER_SITE_OTHER): Payer: 59 | Admitting: Obstetrics and Gynecology

## 2020-08-25 VITALS — BP 129/83 | HR 101 | Ht 66.0 in | Wt 157.0 lb

## 2020-08-25 DIAGNOSIS — R102 Pelvic and perineal pain: Secondary | ICD-10-CM

## 2020-08-25 NOTE — Progress Notes (Signed)
Pt presents today as new patient for evaluation for endometriosis. LMP: 07/04/20, preg test at home July was negative. Pt states she takes OCP everyday at the same time. Pt has been on this OCP for 1 year now. This is not the first time she has had issue with skipped cycle. Pt is experiencing constant pain even when not on cycle in the RLQ to the midline. Pt states that is where most of the adhesions were located during surgery.

## 2020-08-25 NOTE — Progress Notes (Signed)
35 yo P2 with LMP 07/04/20 presenting for evaluation of LLQ pain for the past 6 months. Patient reports a history of endometriosis and PCOS. She had fulguration of her endometriosis in 2018 and has been doing well since. She is on continuous COC and reports near amenorrhea. Over the past 6 months she has noticed worsening LLQ pain. The pain is describes as Saleha Kalp and throbbing. She also has associated dysmenorrhea. She states that most of her endometriosis was located on the left side. Patient denies any relationship with voiding or bowel movements.   Past Medical History:  Diagnosis Date   Ectopic pregnancy    Endometriosis determined by laparoscopy    Seasonal allergies    Past Surgical History:  Procedure Laterality Date   LAPAROSCOPIC ENDOMETRIOSIS FULGURATION     April 2018   Family History  Problem Relation Age of Onset   Diabetes Paternal Grandfather    Hypertension Father    Depression Mother    Social History   Tobacco Use   Smoking status: Never   Smokeless tobacco: Never  Vaping Use   Vaping Use: Never used  Substance Use Topics   Alcohol use: Yes    Comment: rare   Drug use: Not Currently   ROS See pertinent in HPI. All other systems reviewed and non contributory  Blood pressure 129/83, pulse (!) 101, height '5\' 6"'$  (1.676 m), weight 157 lb (71.2 kg), last menstrual period 07/04/2020. GENERAL: Well-developed, well-nourished female in no acute distress.  NEURO: alert and oriented x 3  A/P 35 yo with LLQ pain - pelvic ultrasound ordered to rule out an endometrioma - RTC in 2 weeks for annual exam and discuss results of ultrasound - Information on Orilissa provided as treatment option for her pain

## 2020-08-30 ENCOUNTER — Ambulatory Visit (INDEPENDENT_AMBULATORY_CARE_PROVIDER_SITE_OTHER): Payer: 59

## 2020-08-30 ENCOUNTER — Other Ambulatory Visit: Payer: Self-pay

## 2020-08-30 DIAGNOSIS — R102 Pelvic and perineal pain: Secondary | ICD-10-CM | POA: Diagnosis not present

## 2020-08-30 DIAGNOSIS — N926 Irregular menstruation, unspecified: Secondary | ICD-10-CM

## 2020-09-15 ENCOUNTER — Encounter: Payer: Self-pay | Admitting: Obstetrics and Gynecology

## 2020-09-15 ENCOUNTER — Ambulatory Visit (INDEPENDENT_AMBULATORY_CARE_PROVIDER_SITE_OTHER): Payer: 59 | Admitting: Obstetrics and Gynecology

## 2020-09-15 ENCOUNTER — Other Ambulatory Visit: Payer: Self-pay

## 2020-09-15 ENCOUNTER — Other Ambulatory Visit (HOSPITAL_COMMUNITY)
Admission: RE | Admit: 2020-09-15 | Discharge: 2020-09-15 | Disposition: A | Discharge status: Home / Self Care | Payer: 59 | Source: Ambulatory Visit | Attending: Obstetrics and Gynecology | Admitting: Obstetrics and Gynecology

## 2020-09-15 VITALS — BP 127/83 | HR 89 | Resp 16 | Ht 66.0 in | Wt 158.0 lb

## 2020-09-15 DIAGNOSIS — Z01419 Encounter for gynecological examination (general) (routine) without abnormal findings: Secondary | ICD-10-CM

## 2020-09-15 MED ORDER — NORETHIN ACE-ETH ESTRAD-FE 1.5-30 MG-MCG PO TABS: ORAL_TABLET | ORAL | 4 refills | Status: AC

## 2020-09-15 MED ORDER — TRAMADOL HCL 50 MG PO TABS
50.0000 mg | ORAL_TABLET | Freq: Four times a day (QID) | ORAL | 0 refills | Status: AC | PRN
Start: 2020-09-15 — End: ?

## 2020-09-15 NOTE — Progress Notes (Signed)
Subjective:     Breanna Mcmillan is a 35 y.o. female P2 and is here for a comprehensive physical exam. The patient reports persistent RLQ pain unchanged since last seen. She states that intercourse is painful as it triggers the pain. The pain is mildly relieved by ibuprofen and tylenol. Patient states that she researched Isaac Bliss and is concerned that it may trigger depression and anxiety. Patient is without any other complaints.   Past Medical History:  Diagnosis Date   Ectopic pregnancy    Endometriosis determined by laparoscopy    Seasonal allergies    Past Surgical History:  Procedure Laterality Date   LAPAROSCOPIC ENDOMETRIOSIS FULGURATION     April 2018   Family History  Problem Relation Age of Onset   Diabetes Paternal Grandfather    Hypertension Father    Depression Mother     Social History   Socioeconomic History   Marital status: Married    Spouse name: micheal   Number of children: Not on file   Years of education: Not on file   Highest education level: Not on file  Occupational History   Occupation: Glass blower/designer.    Employer: [employer]  Tobacco Use   Smoking status: Never   Smokeless tobacco: Never  Vaping Use   Vaping Use: Never used  Substance and Sexual Activity   Alcohol use: Yes    Comment: rare   Drug use: Not Currently   Sexual activity: Yes    Partners: Male    Birth control/protection: Pill  Other Topics Concern   Not on file  Social History Narrative   Father with IMGUS (possible multiple myeloma).  Occ exercise.  1 caffeine drink per day.    Social Determinants of Health   Financial Resource Strain: Not on file  Food Insecurity: Not on file  Transportation Needs: Not on file  Physical Activity: Not on file  Stress: Not on file  Social Connections: Not on file  Intimate Partner Violence: Not on file   Health Maintenance  Topic Date Due   HIV Screening  Never done   Hepatitis C Screening  Never done   COVID-19 Vaccine (3  - Booster for Pfizer series) 09/22/2019   INFLUENZA VACCINE  08/08/2020   PAP SMEAR-Modifier  01/14/2024   TETANUS/TDAP  06/02/2025   Pneumococcal Vaccine 24-18 Years old  Aged Out   HPV VACCINES  Aged Out       Review of Systems Pertinent items noted in HPI and remainder of comprehensive ROS otherwise negative.   Objective:  Blood pressure 127/83, pulse 89, resp. rate 16, height '5\' 6"'  (1.676 m), weight 158 lb (71.7 kg), last menstrual period 09/14/2020.   GENERAL: Well-developed, well-nourished female in no acute distress.  HEENT: Normocephalic, atraumatic. Sclerae anicteric.  NECK: Supple. Normal thyroid.  LUNGS: Clear to auscultation bilaterally.  HEART: Regular rate and rhythm. BREASTS: Symmetric in size. No palpable masses or lymphadenopathy, skin changes, or nipple drainage. ABDOMEN: Soft, nontender, nondistended. No organomegaly. PELVIC: Normal external female genitalia. Vagina is pink and rugated.  Normal discharge. Normal appearing cervix. Uterus is normal in size. No adnexal mass or tenderness. Chaperone present during the pelvic exam EXTREMITIES: No cyanosis, clubbing, or edema, 2+ distal pulses.    US PELVIC COMPLETE WITH TRANSVAGINAL  Result Date: 08/30/2020 CLINICAL DATA:  LEFT lower quadrant pain history endometriosis, irregular menses, unknown LMP EXAM: TRANSABDOMINAL AND TRANSVAGINAL ULTRASOUND OF PELVIS TECHNIQUE: Both transabdominal and transvaginal ultrasound examinations of the pelvis were performed. Transabdominal technique was  performed for global imaging of the pelvis including uterus, ovaries, adnexal regions, and pelvic cul-de-sac. It was necessary to proceed with endovaginal exam following the transabdominal exam to visualize the ovaries and adnexa. COMPARISON:  07/05/2015 FINDINGS: Uterus Measurements: 10.1 x 4.6 x 6.4 cm = volume: 155 mL. Anteverted. Slightly heterogeneous myometrium. No focal mass. Endometrium Thickness: 4 mm.  No endometrial fluid or focal  abnormality Right ovary Measurements: 2.6 x 1.5 x 1.9 cm = volume: 4 mL. Normal morphology without mass Left ovary Measurements: 3.6 x 2.8 x 2.8 cm = volume: 14 mL. Mildly complicated cyst within LEFT ovary 2.9 x 2.4 x 2.6 cm containing a single septation and slightly nodular wall. No blood flow within the areas of nodularity. Other findings Trace free pelvic fluid. Numerous veins in the adnexa bilaterally, nonspecific, but can be seen with pelvic congestion syndrome. IMPRESSION: Mildly complicated cyst of the LEFT ovary 2.9 cm greatest size containing a septation and areas of nodularity along the wall; short-term follow-up ultrasound recommended in 6-12 weeks to reassess. Numerous veins in the adnexa bilaterally, nonspecific but can be seen with pelvic congestion syndrome. Remainder of exam unremarkable. Electronically Signed   By: Lavonia Dana M.D.   On: 08/30/2020 12:05    Assessment:    Healthy female exam.      Plan:    Pap smear collected Reviewed ultrasound results with patient Follow up ultrasound scheduled Discussed medical management with pain medication and continued use of OCP. Informed patient that pain may be related to scar tissue as well. If no improvement of pain in 3-6 months, may consider diagnostic laparoscopy. Patient is reluctant about surgical intervention as well See After Visit Summary for Counseling Recommendations

## 2020-09-22 ENCOUNTER — Other Ambulatory Visit: Payer: 59

## 2020-09-22 LAB — CYTOLOGY - PAP: Diagnosis: NEGATIVE

## 2021-01-25 ENCOUNTER — Other Ambulatory Visit: Payer: Self-pay | Admitting: Family Medicine

## 2022-02-24 IMAGING — US US PELVIS COMPLETE WITH TRANSVAGINAL
1 series · 13 of 25 positions shown · non-contrast
Comparison: 07/05/2015

CLINICAL DATA: LEFT lower quadrant pain history endometriosis,
irregular menses, unknown LMP

EXAM:
TRANSABDOMINAL AND TRANSVAGINAL ULTRASOUND OF PELVIS
TECHNIQUE: Both transabdominal and transvaginal ultrasound examinations of the
pelvis were performed. Transabdominal technique was performed for
global imaging of the pelvis including uterus, ovaries, adnexal
regions, and pelvic cul-de-sac. It was necessary to proceed with
endovaginal exam following the transabdominal exam to visualize the
ovaries and adnexa.

[Series 1: us pelvic complete with transvaginal · 13 of 100 slices shown]
[im 1/100]
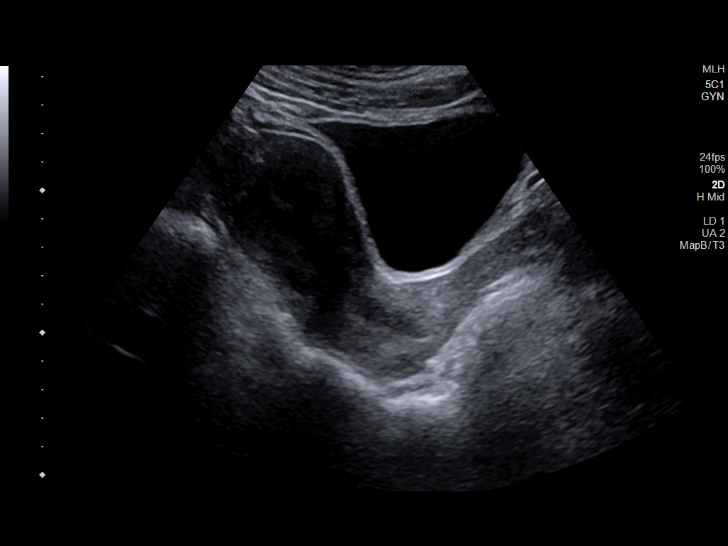
[im 9/100]
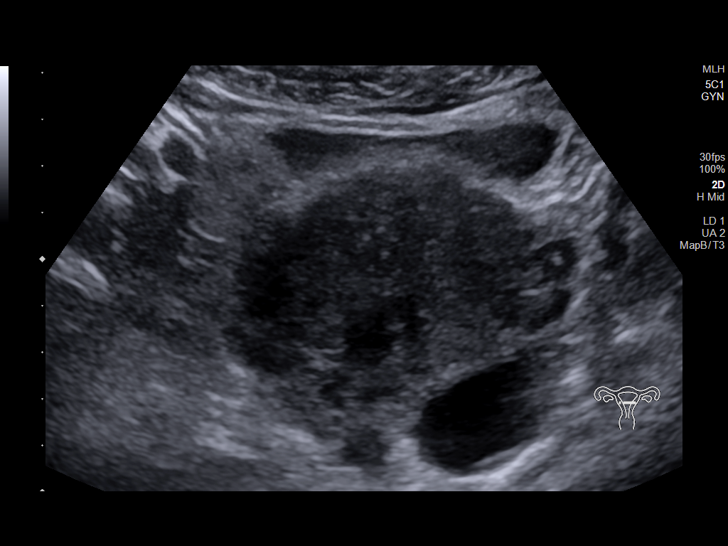
[im 17/100]
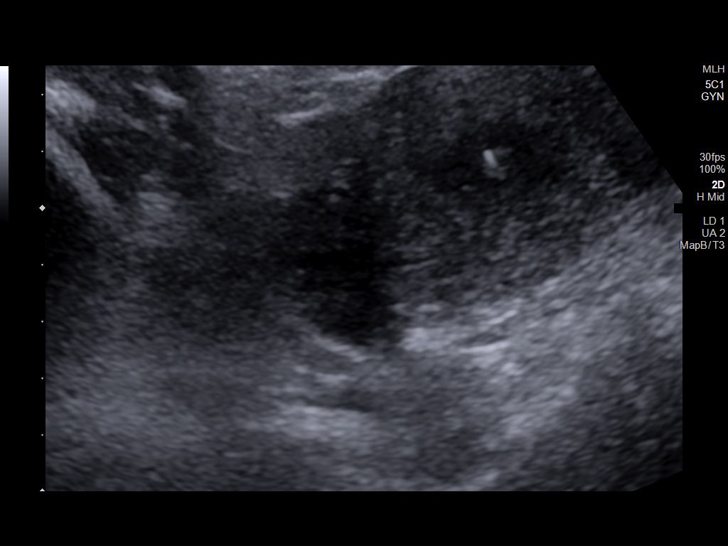
[im 25/100]
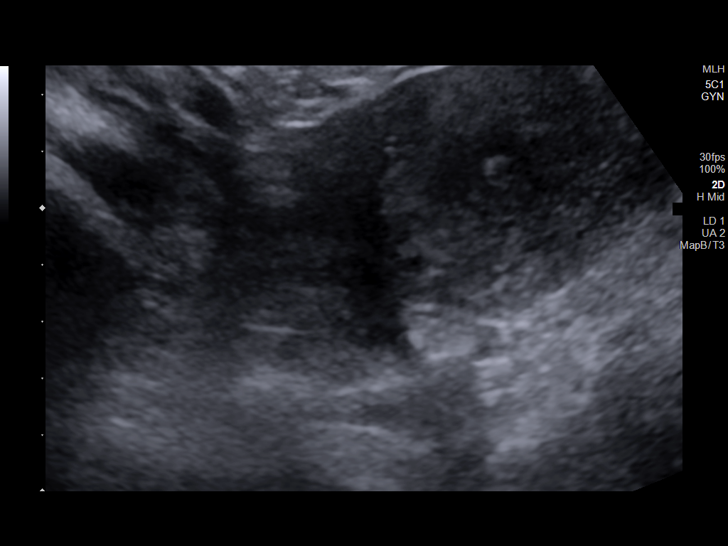
[im 34/100]
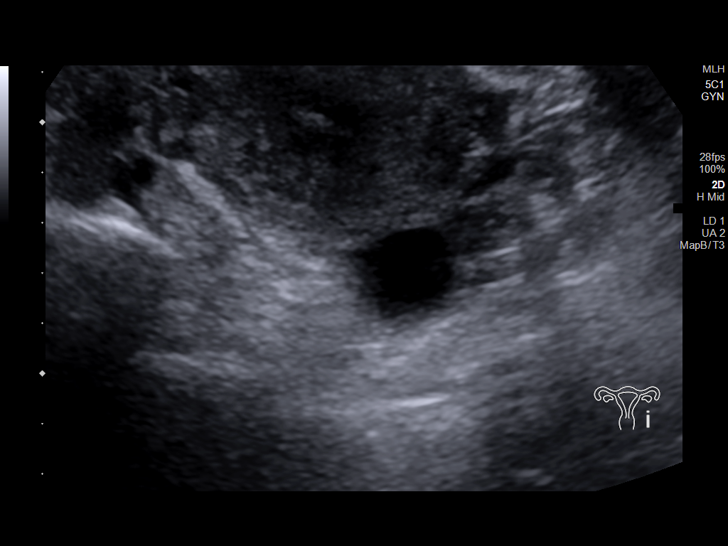
[im 42/100]
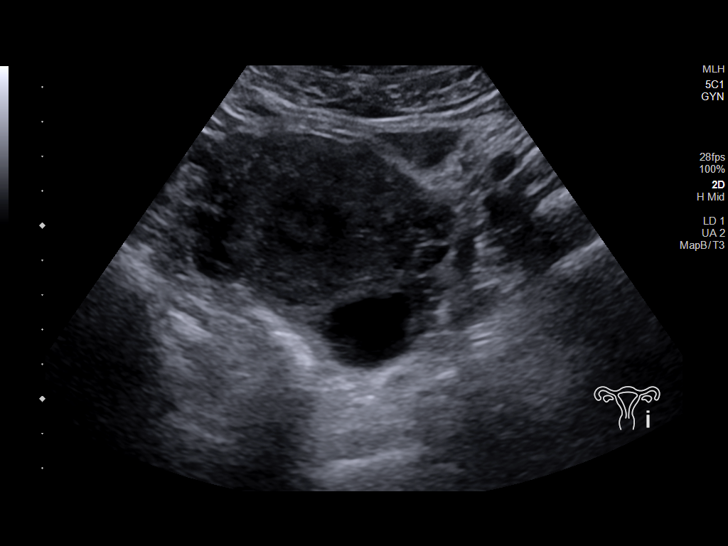
[im 50/100]
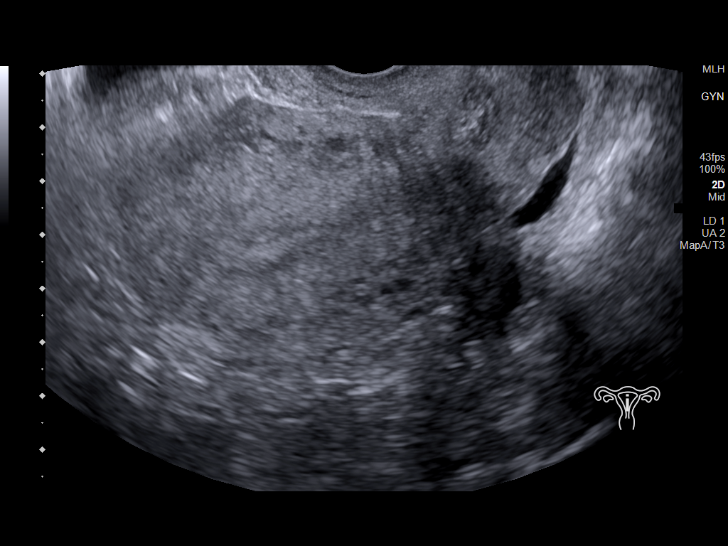
[im 58/100]
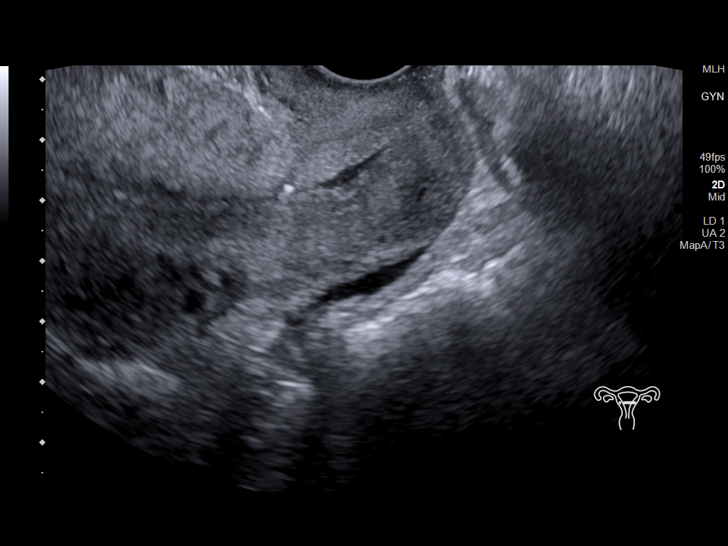
[im 67/100]
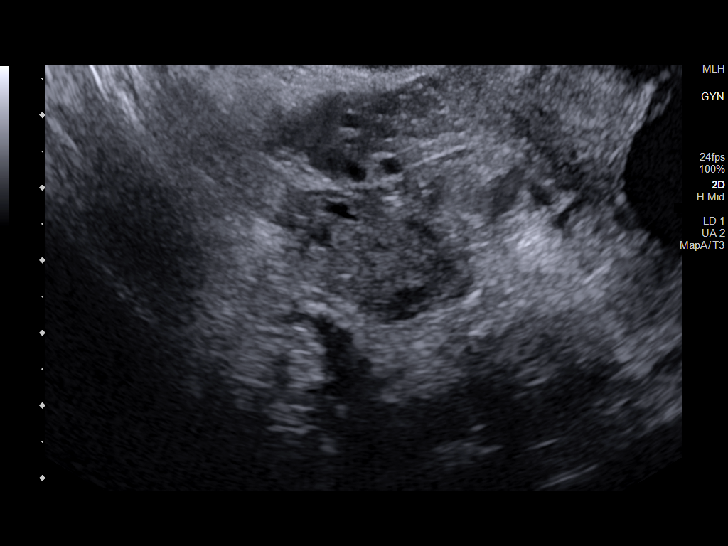
[im 75/100]
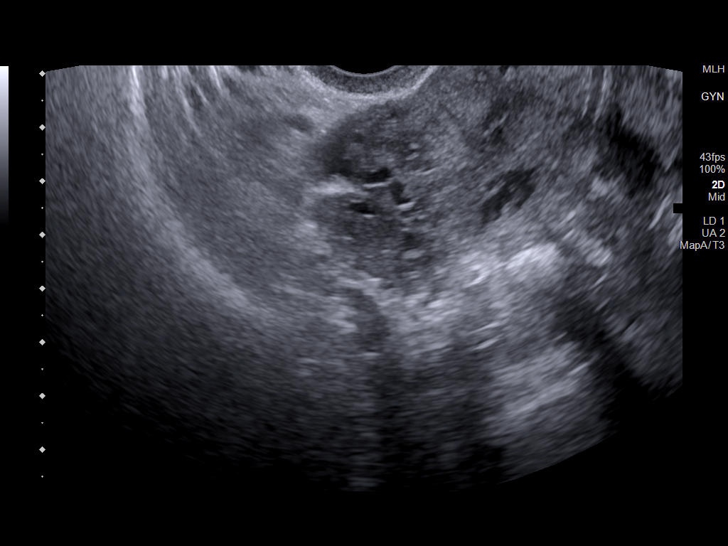
[im 83/100]
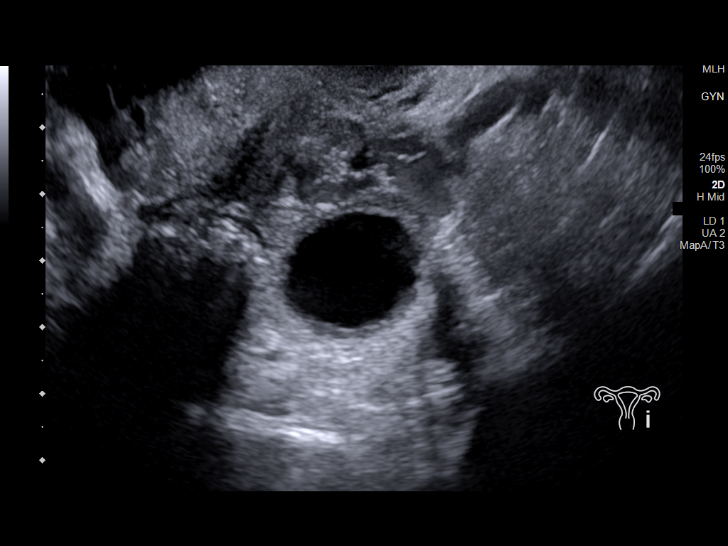
[im 91/100]
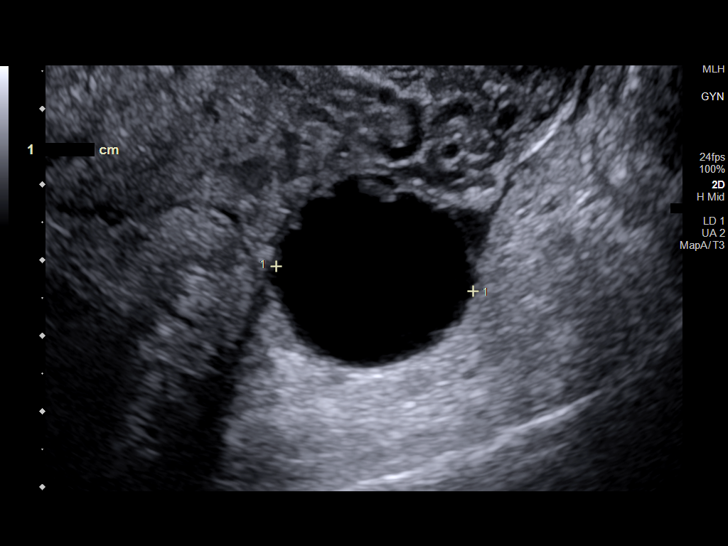
[im 100/100]
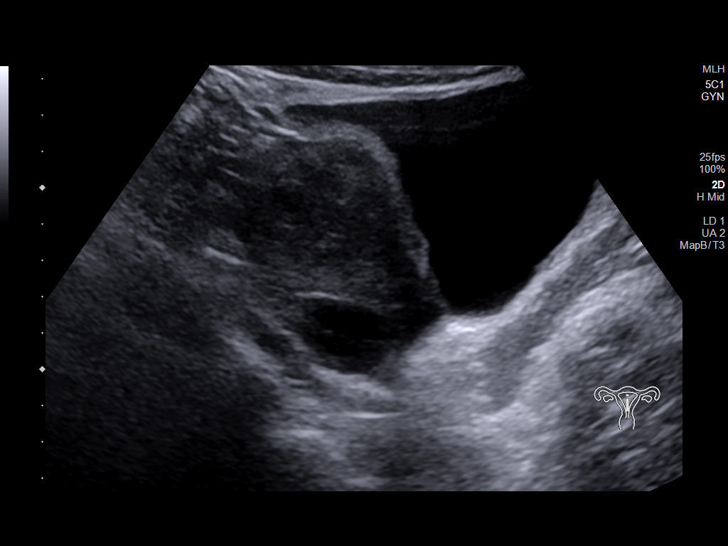

[13 of 25 positions shown; findings below may reference images not displayed]

FINDINGS: Uterus

Measurements: 10.1 x 4.6 x 6.4 cm = volume: 155 mL. Anteverted.
Slightly heterogeneous myometrium. No focal mass.

Endometrium

Thickness: 4 mm.  No endometrial fluid or focal abnormality

Right ovary

Measurements: 2.6 x 1.5 x 1.9 cm = volume: 4 mL. Normal morphology
without mass

Left ovary

Measurements: 3.6 x 2.8 x 2.8 cm = volume: 14 mL. Mildly complicated
cyst within LEFT ovary 2.9 x 2.4 x 2.6 cm containing a single
septation and slightly nodular wall. No blood flow within the areas
of nodularity.

Other findings

Trace free pelvic fluid. Numerous veins in the adnexa bilaterally,
nonspecific, but can be seen with pelvic congestion syndrome.
IMPRESSION: Mildly complicated cyst of the LEFT ovary 2.9 cm greatest size
containing a septation and areas of nodularity along the wall;
short-term follow-up ultrasound recommended in 6-12 weeks to
reassess.

Numerous veins in the adnexa bilaterally, nonspecific but can be
seen with pelvic congestion syndrome.

Remainder of exam unremarkable.
